# Patient Record
Sex: Male | Born: 1990 | Race: White | Hispanic: No | Smoking: Former smoker
Health system: Southern US, Community
[De-identification: ages and names within clinical notes are randomized; demographics above are authoritative.]

## PROBLEM LIST (undated history)

## (undated) ENCOUNTER — Emergency Department (HOSPITAL_COMMUNITY): Payer: Self-pay | Source: Home / Self Care

## (undated) ENCOUNTER — Emergency Department (HOSPITAL_COMMUNITY): Admission: EM | Source: Home / Self Care

## (undated) DIAGNOSIS — F329 Major depressive disorder, single episode, unspecified: Secondary | ICD-10-CM

## (undated) DIAGNOSIS — F32A Depression, unspecified: Secondary | ICD-10-CM

## (undated) DIAGNOSIS — F319 Bipolar disorder, unspecified: Secondary | ICD-10-CM

## (undated) DIAGNOSIS — M1990 Unspecified osteoarthritis, unspecified site: Secondary | ICD-10-CM

## (undated) HISTORY — PX: TONSILLECTOMY: SUR1361

## (undated) HISTORY — PX: VASECTOMY: SHX75

---
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
## 2009-04-19 ENCOUNTER — Emergency Department (HOSPITAL_COMMUNITY): Admission: EM | Admit: 2009-04-19 | Discharge: 2009-04-19 | Payer: Self-pay | Admitting: Emergency Medicine

## 2009-04-27 ENCOUNTER — Emergency Department (HOSPITAL_COMMUNITY): Admission: EM | Admit: 2009-04-27 | Discharge: 2009-04-27 | Payer: Self-pay | Admitting: Emergency Medicine

## 2011-03-09 LAB — DIFFERENTIAL
Basophils Absolute: 0 10*3/uL (ref 0.0–0.1)
Basophils Relative: 0 % (ref 0–1)
Eosinophils Absolute: 0.1 10*3/uL (ref 0.0–0.7)
Eosinophils Absolute: 0.2 K/uL (ref 0.0–0.7)
Eosinophils Relative: 2 % (ref 0–5)
Eosinophils Relative: 3 % (ref 0–5)
Lymphocytes Relative: 17 % (ref 12–46)
Lymphocytes Relative: 26 % (ref 12–46)
Lymphs Abs: 1.2 10*3/uL (ref 0.7–4.0)
Lymphs Abs: 2.3 K/uL (ref 0.7–4.0)
Monocytes Absolute: 0.4 10*3/uL (ref 0.1–1.0)
Monocytes Absolute: 0.6 K/uL (ref 0.1–1.0)
Monocytes Relative: 6 % (ref 3–12)
Monocytes Relative: 7 % (ref 3–12)
Neutro Abs: 5.7 10*3/uL (ref 1.7–7.7)
Neutrophils Relative %: 65 % (ref 43–77)

## 2011-03-09 LAB — RAPID URINE DRUG SCREEN, HOSP PERFORMED
Amphetamines: NOT DETECTED
Barbiturates: NOT DETECTED
Benzodiazepines: NOT DETECTED
Cocaine: NOT DETECTED
Cocaine: NOT DETECTED
Opiates: NOT DETECTED
Tetrahydrocannabinol: POSITIVE — AB

## 2011-03-09 LAB — BASIC METABOLIC PANEL
BUN: 11 mg/dL (ref 6–23)
BUN: 9 mg/dL (ref 6–23)
CO2: 30 mEq/L (ref 19–32)
Calcium: 9.7 mg/dL (ref 8.4–10.5)
Chloride: 105 mEq/L (ref 96–112)
Chloride: 106 mEq/L (ref 96–112)
Creatinine, Ser: 1.03 mg/dL (ref 0.4–1.5)
GFR calc Af Amer: >60 mL/min (ref >60)
GFR calc Af Amer: >60 mL/min (ref >60)
GFR calc non Af Amer: >60 mL/min (ref >60)
GFR calc non Af Amer: >60 mL/min (ref >60)
Glucose, Bld: 100 mg/dL — ABNORMAL HIGH (ref 70–99)
Potassium: 3.5 mEq/L (ref 3.5–5.1)
Potassium: 3.9 mEq/L (ref 3.5–5.1)
Sodium: 139 mEq/L (ref 135–145)
Sodium: 140 mEq/L (ref 135–145)

## 2011-03-09 LAB — CBC
HCT: 39.7 % (ref 39.0–52.0)
HCT: 41.1 % (ref 39.0–52.0)
Hemoglobin: 14.1 g/dL (ref 13.0–17.0)
Hemoglobin: 14.6 g/dL (ref 13.0–17.0)
MCHC: 35.6 g/dL (ref 30.0–36.0)
MCV: 91.1 fL (ref 78.0–100.0)
MCV: 91.2 fL (ref 78.0–100.0)
Platelets: 222 10*3/uL (ref 150–400)
RBC: 4.35 MIL/uL (ref 4.22–5.81)
RBC: 4.51 MIL/uL (ref 4.22–5.81)
RDW: 13.6 % (ref 11.5–15.5)
WBC: 6.9 10*3/uL (ref 4.0–10.5)
WBC: 8.8 10*3/uL (ref 4.0–10.5)

## 2011-03-09 LAB — URINALYSIS, ROUTINE W REFLEX MICROSCOPIC
Ketones, ur: NEGATIVE mg/dL
Nitrite: NEGATIVE
Urobilinogen, UA: 0.2 mg/dL (ref 0.0–1.0)
pH: 5.5 (ref 5.0–8.0)

## 2011-03-09 LAB — ETHANOL
Alcohol, Ethyl (B): <5 mg/dL (ref 0–10)
Alcohol, Ethyl (B): <5 mg/dL (ref 0–10)

## 2011-04-16 NOTE — Op Note (Signed)
NAME:  Joshua Stone, Joshua Stone              ACCOUNT NO.:  [id number]   MEDICAL RECORD NO.:  [id number]          PATIENT TYPE:  AMB   LOCATION:  [facility]                          FACILITY:  [facility]   PHYSICIAN:  Suzanna Obey, M.D.       DATE OF BIRTH:  02-02-1991   DATE OF PROCEDURE:  03/23/2007  DATE OF DISCHARGE:                               OPERATIVE REPORT   PREOPERATIVE DIAGNOSIS:  Chronic tonsillitis.   POSTOPERATIVE DIAGNOSIS:  Chronic tonsillitis.   SURGICAL PROCEDURE:  Tonsillectomy.   ANESTHESIA:  General.   ESTIMATED BLOOD LOSS:  Less than 5 mL.   INDICATION:  This is a 20 year old who has had repetitive tonsillitis  episodes and sore throats.  It has been refractory to medical therapy.  He is ready to proceed with a tonsillectomy.  He was informed of the  risks and benefits of the procedure including bleeding, infection,  velopharyngeal insufficiency, change in the voice, chronic pain and risk  of the anesthetic.  All questions were answered and consent was  obtained.  This was discussed with his parents as well, consent was  given.   OPERATION:  The patient was taken to the operating room and placed in  the supine position.  After adequate general endotracheal tube  anesthesia, was placed in the rose position, draped in the usual sterile  manner.  The Crowe-Davis mouth gag was inserted, retracted and suspended  from the Mayo stand.  The left tonsil begun making an anterior tonsillar  pillar incision, identifying the capsule tonsil and removing it with  electrocautery dissection.  The right tonsil removed in the same  fashion.  The Crowe-Davis was released and resuspended.  There is  hemostasis present in all locations.  The hypopharynx, esophagus and  stomach were suctioned with the NG tube.  The Crowe-Davis was removed.  Patient was awakened and brought to recovery in stable condition.  Counts are correct.           ______________________________  Suzanna Obey, M.D.     JB/MEDQ  D:  03/23/2007  T:  03/23/2007  Job:  [id number]

## 2011-04-18 ENCOUNTER — Emergency Department (HOSPITAL_COMMUNITY)
Admission: EM | Admit: 2011-04-18 | Discharge: 2011-04-18 | Disposition: A | Discharge status: Home / Self Care | Payer: Self-pay | Attending: Emergency Medicine | Admitting: Emergency Medicine

## 2011-04-18 DIAGNOSIS — L02219 Cutaneous abscess of trunk, unspecified: Secondary | ICD-10-CM | POA: Insufficient documentation

## 2011-05-10 ENCOUNTER — Other Ambulatory Visit (HOSPITAL_COMMUNITY)
Admission: RE | Admit: 2011-05-10 | Discharge: 2011-05-10 | Disposition: A | Discharge status: Home / Self Care | Payer: Medicaid Other | Source: Ambulatory Visit | Attending: Urology | Admitting: Urology

## 2011-05-10 ENCOUNTER — Other Ambulatory Visit: Payer: Self-pay | Admitting: Urology

## 2011-05-10 DIAGNOSIS — Z302 Encounter for sterilization: Secondary | ICD-10-CM | POA: Insufficient documentation

## 2011-06-07 ENCOUNTER — Emergency Department (HOSPITAL_COMMUNITY)
Admission: EM | Admit: 2011-06-07 | Discharge: 2011-06-07 | Disposition: A | Discharge status: Home / Self Care | Payer: Medicaid Other | Attending: Emergency Medicine | Admitting: Emergency Medicine

## 2011-06-07 ENCOUNTER — Encounter: Payer: Self-pay | Admitting: *Deleted

## 2011-06-07 DIAGNOSIS — L089 Local infection of the skin and subcutaneous tissue, unspecified: Secondary | ICD-10-CM | POA: Insufficient documentation

## 2011-06-07 DIAGNOSIS — R609 Edema, unspecified: Secondary | ICD-10-CM | POA: Insufficient documentation

## 2011-06-07 MED ORDER — SULFAMETHOXAZOLE-TMP DS 800-160 MG PO TABS
1.0000 | ORAL_TABLET | Freq: Once | ORAL | Status: AC
  Administered 2011-06-07: 1 via ORAL
  Filled 2011-06-07: qty 1

## 2011-06-07 MED ORDER — SULFAMETHOXAZOLE-TRIMETHOPRIM 800-160 MG PO TABS: 1.0000 | ORAL_TABLET | Freq: Two times a day (BID) | ORAL | Status: AC

## 2011-06-07 NOTE — ED Notes (Signed)
C/o 2 ?abscessed areas to right groin area; also c/o ?spider bite to left inner thigh onset last night-area is reddened, warm to touch, with streaking up left thigh

## 2011-06-08 NOTE — ED Provider Notes (Signed)
History     Chief Complaint  Patient presents with  . Abscess   HPI  History reviewed. No pertinent past medical history.  Past Surgical History  Procedure Date  . Tonsillectomy     No family history on file.  History  Substance Use Topics  . Smoking status: Never Smoker   . Smokeless tobacco: Not on file  . Alcohol Use: Yes     ocassional      Review of Systems  Physical Exam  BP 142/77  Pulse 66  Temp(Src) 99 F (37.2 C) (Oral)  Resp 20  Ht 5\' 10"  (1.778 m)  Wt 180 lb (81.647 kg)  BMI 25.83 kg/m2  SpO2 100%  Physical Exam  ED Course  Procedures  MDM  I agree with the history, physical, assessment, and plan of care, with the following exceptions:    Ebony Hail, MD 06/14/11 [phone]

## 2011-06-12 NOTE — ED Provider Notes (Addendum)
History     Chief Complaint  Patient presents with  . Abscess   HPI Comments: He woke yesterday morning with a presumed spider bite of his left upper thigh which has become more reddened and warm to touch.  He did not see a spider.  Also reports prior episode of similar lesion in right suprapubic area which has almost resolved,  But still has a firm tender "knot" at the location  Patient is a 20 y.o. male presenting with abscess. The history is provided by the patient.  Abscess  This is a new problem. The current episode started yesterday. The onset was gradual. The problem has been gradually worsening. The abscess is present on the left upper leg. The problem is moderate. The abscess is characterized by redness. Pertinent negatives include no fever, no congestion and no sore throat. His past medical history does not include skin abscesses in family. There were no sick contacts. He has received no recent medical care.    History reviewed. No pertinent past medical history.  Past Surgical History  Procedure Date  . Tonsillectomy     No family history on file.  History  Substance Use Topics  . Smoking status: Never Smoker   . Smokeless tobacco: Not on file  . Alcohol Use: Yes     ocassional      Review of Systems  Constitutional: Negative for fever.  HENT: Negative for congestion, sore throat and neck pain.   Eyes: Negative.   Respiratory: Negative for chest tightness and shortness of breath.   Cardiovascular: Negative for chest pain.  Gastrointestinal: Negative for nausea and abdominal pain.  Genitourinary: Negative for dysuria and difficulty urinating.  Musculoskeletal: Negative for joint swelling and arthralgias.  Skin: Negative for rash.  Neurological: Negative for dizziness, weakness, numbness and headaches.  Hematological: Negative.   Psychiatric/Behavioral: Negative.     Physical Exam  BP 142/77  Pulse 66  Temp(Src) 99 F (37.2 C) (Oral)  Resp 20  Ht 5\' 10"   (1.778 m)  Wt 180 lb (81.647 kg)  BMI 25.83 kg/m2  SpO2 100%  Physical Exam  Vitals reviewed. Constitutional: He is oriented to person, place, and time. He appears well-developed and well-nourished.  HENT:  Head: Normocephalic and atraumatic.  Eyes: Conjunctivae are normal.  Neck: Normal range of motion.  Cardiovascular: Normal rate, regular rhythm, normal heart sounds and intact distal pulses.   Pulmonary/Chest: Effort normal and breath sounds normal. He has no wheezes.  Abdominal: Soft. Bowel sounds are normal. There is no tenderness.  Musculoskeletal: Normal range of motion.  Neurological: He is alert and oriented to person, place, and time.  Skin: Skin is warm and dry. Lesion noted. No rash noted. There is erythema.          Edema noted left medial thigh,  No fluctuance or drainage,  approx 2 cm diameter with surrounding erythema of 3 cm.  Small induration right suprapubic area,  Nontender,  No erythema.  Psychiatric: He has a normal mood and affect.    ED Course  Procedures  MDM No palpable drainable abscess.  Medical screening examination/treatment/procedure(s) were conducted as a shared visit with non-physician practitioner(s) and myself.  I personally evaluated the patient during the encounter     Candis Musa, PA 06/12/11 1950  Candis Musa, PA 06/25/11 1641  Sunnie Nielsen, MD 06/29/11 [phone]

## 2011-12-08 ENCOUNTER — Emergency Department (HOSPITAL_COMMUNITY)
Admission: EM | Admit: 2011-12-08 | Discharge: 2011-12-08 | Disposition: A | Discharge status: Home / Self Care | Payer: Self-pay | Attending: Emergency Medicine | Admitting: Emergency Medicine

## 2011-12-08 ENCOUNTER — Ambulatory Visit (HOSPITAL_COMMUNITY)
Admission: RE | Admit: 2011-12-08 | Discharge: 2011-12-08 | Disposition: A | Discharge status: Home / Self Care | Payer: Self-pay | Source: Ambulatory Visit | Attending: Emergency Medicine | Admitting: Emergency Medicine

## 2011-12-08 ENCOUNTER — Other Ambulatory Visit (HOSPITAL_COMMUNITY): Payer: Self-pay | Admitting: Emergency Medicine

## 2011-12-08 DIAGNOSIS — R52 Pain, unspecified: Secondary | ICD-10-CM

## 2011-12-08 DIAGNOSIS — S62339A Displaced fracture of neck of unspecified metacarpal bone, initial encounter for closed fracture: Secondary | ICD-10-CM | POA: Insufficient documentation

## 2011-12-08 DIAGNOSIS — X838XXA Intentional self-harm by other specified means, initial encounter: Secondary | ICD-10-CM | POA: Insufficient documentation

## 2011-12-08 MED ORDER — ALBUTEROL SULFATE HFA 108 (90 BASE) MCG/ACT IN AERS
2.0000 | INHALATION_SPRAY | Freq: Once | RESPIRATORY_TRACT | Status: AC
  Administered 2011-12-08: 2 via RESPIRATORY_TRACT
  Filled 2011-12-08: qty 6.7

## 2011-12-08 MED ORDER — HYDROCODONE-ACETAMINOPHEN 5-325 MG PO TABS: 2.0000 | ORAL_TABLET | ORAL | Status: AC | PRN

## 2011-12-08 NOTE — ED Notes (Signed)
Pt triaged on downtime forms.

## 2011-12-08 NOTE — ED Provider Notes (Signed)
History     CSN: [id number]  Arrival date & time 12/08/11  0117   None     No chief complaint on file.   (Consider location/radiation/quality/duration/timing/severity/associated sxs/prior treatment) HPI Comments: Patient presents with right hand pain after punching his car 4 days ago. Pain is over the fifth MCP joint and ulnar side of the hand. Is not taking anything for the pain. He has full range of motion of his fingers and does not have any numbness or weakness or tingling. There no open wounds in the skin. He also complains of a cough for the past 2 weeks that is productive at times. He is an active smoker. No chest pain or shortness of breath.  The history is provided by the patient.    No past medical history on file.  Past Surgical History  Procedure Date  . Tonsillectomy     No family history on file.  History  Substance Use Topics  . Smoking status: Never Smoker   . Smokeless tobacco: Not on file  . Alcohol Use: Yes     ocassional      Review of Systems  All other systems reviewed and are negative.    Allergies  Cashews and Zoloft  Home Medications  No current outpatient prescriptions on file.  There were no vitals taken for this visit.  Physical Exam  Constitutional: He is oriented to person, place, and time. He appears well-developed and well-nourished. No distress.  HENT:  Head: Normocephalic and atraumatic.  Mouth/Throat: Oropharynx is clear and moist.  Eyes: Conjunctivae and EOM are normal. Pupils are equal, round, and reactive to light.  Neck: Neck supple.  Cardiovascular: Normal rate, regular rhythm and normal heart sounds.   Pulmonary/Chest: Effort normal and breath sounds normal. No respiratory distress. He has no wheezes.  Abdominal: Soft. There is no tenderness.  Musculoskeletal: He exhibits edema and tenderness.       Tenderness and swelling over distal right fifth metacarpal. No breaks in skin. Cardinal hand movements intact. +2  radial pulse. No disruption of finger cascade  Neurological: He is oriented to person, place, and time. No cranial nerve deficit.  Skin: Skin is warm.    ED Course  Procedures (including critical care time)  Labs Reviewed - No data to display Dg Hand Complete Right  12/08/2011  *RADIOLOGY REPORT*  Clinical Data: Right hand pain.  Fifth metacarpal pain  RIGHT HAND - COMPLETE 3+ VIEW  Comparison: 04/27/2009.  Findings: There is an angulated fifth metacarpal neck fracture compatible with boxers fracture.  Apex dorsal angulation.  Soft tissue swelling is present.  The phalanges of the fifth finger appear normal.  Sclerosis in the distal scaphoid pole is compatible with a large bone island.  IMPRESSION: Fifth metacarpal neck fracture consistent with boxers fracture.  Original Report Authenticated By: Andreas Newport, M.D.     No diagnosis found.    MDM  Hand Injury with likely a boxer's fracture.  Neurovascularly intact.  No breaks in skin.  Splint, pain control, f/u hand surgery clinic.         Glynn Octave, MD 12/08/11 [phone]

## 2012-05-30 ENCOUNTER — Emergency Department (HOSPITAL_COMMUNITY)
Admission: EM | Admit: 2012-05-30 | Discharge: 2012-05-30 | Disposition: A | Discharge status: Home / Self Care | Payer: Self-pay | Attending: Emergency Medicine | Admitting: Emergency Medicine

## 2012-05-30 ENCOUNTER — Encounter (HOSPITAL_COMMUNITY): Payer: Self-pay | Admitting: *Deleted

## 2012-05-30 DIAGNOSIS — Y990 Civilian activity done for income or pay: Secondary | ICD-10-CM | POA: Insufficient documentation

## 2012-05-30 DIAGNOSIS — W268XXA Contact with other sharp object(s), not elsewhere classified, initial encounter: Secondary | ICD-10-CM | POA: Insufficient documentation

## 2012-05-30 DIAGNOSIS — S91309A Unspecified open wound, unspecified foot, initial encounter: Secondary | ICD-10-CM | POA: Insufficient documentation

## 2012-05-30 DIAGNOSIS — Y9289 Other specified places as the place of occurrence of the external cause: Secondary | ICD-10-CM | POA: Insufficient documentation

## 2012-05-30 DIAGNOSIS — S91331A Puncture wound without foreign body, right foot, initial encounter: Secondary | ICD-10-CM

## 2012-05-30 MED ORDER — TETANUS-DIPHTH-ACELL PERTUSSIS 5-2.5-18.5 LF-MCG/0.5 IM SUSP
0.5000 mL | Freq: Once | INTRAMUSCULAR | Status: AC
  Administered 2012-05-30: 0.5 mL via INTRAMUSCULAR
  Filled 2012-05-30: qty 0.5

## 2012-05-30 NOTE — ED Notes (Signed)
Pt states that he found a piece of rusted metal in his right foot on Sunday. States that he pulled it out but needs a tetanus shot.

## 2012-05-30 NOTE — ED Provider Notes (Signed)
History     CSN: [id number]  Arrival date & time 05/30/12  1735   First MD Initiated Contact with Patient 05/30/12 1744      Chief Complaint  Patient presents with  . Foot Injury    (Consider location/radiation/quality/duration/timing/severity/associated sxs/prior treatment) HPI Comments: Patient comes to the ER requesting a tetanus shot. States that he removed a piece of metal from his right foot 2 days ago. He states  the foreign body was removed completely. He denies any pain, swelling, redness, numbness or drainage of the foot. States he is able to bear weight without any difficulty.  Patient is a 21 y.o. male presenting with foot injury. The history is provided by the patient.  Foot Injury  The incident occurred 2 days ago. The incident occurred at work. Injury mechanism: puncture wound to lateral right foot. The pain is present in the right foot. The patient is experiencing no pain. Pertinent negatives include no numbness, no inability to bear weight, no loss of motion, no muscle weakness, no loss of sensation and no tingling. He reports no foreign bodies present. Nothing aggravates the symptoms. He has tried nothing for the symptoms. The treatment provided no relief.    History reviewed. No pertinent past medical history.  Past Surgical History  Procedure Date  . Tonsillectomy     History reviewed. No pertinent family history.  History  Substance Use Topics  . Smoking status: Never Smoker   . Smokeless tobacco: Not on file  . Alcohol Use: Yes     ocassional      Review of Systems  Constitutional: Negative for fever and chills.  Genitourinary: Negative for dysuria and difficulty urinating.  Musculoskeletal: Negative for joint swelling, arthralgias and gait problem.  Skin: Positive for wound. Negative for color change.  Neurological: Negative for tingling and numbness.  All other systems reviewed and are negative.    Allergies  Cashews and Zoloft  Home  Medications  No current outpatient prescriptions on file.  BP 142/80  Pulse 60  Temp 98.6 F (37 C) (Oral)  Resp 16  Ht 5\' 10"  (1.778 m)  Wt 170 lb (77.111 kg)  BMI 24.39 kg/m2  Physical Exam  Nursing note and vitals reviewed. Constitutional: He is oriented to person, place, and time. He appears well-developed and well-nourished. No distress.  HENT:  Head: Normocephalic and atraumatic.  Cardiovascular: Normal rate, regular rhythm, normal heart sounds and intact distal pulses.   No murmur heard. Pulmonary/Chest: Effort normal and breath sounds normal.  Musculoskeletal: Normal range of motion. He exhibits no edema and no tenderness.       Right foot: He exhibits normal range of motion, no tenderness, no bony tenderness, no swelling, no crepitus, no deformity and no laceration.       Feet:       Pin point, well healed puncture wound of the lateral right foot.    ROM is preserved.  DP pulse is brisk, sensation intact.  No erythema, edema, abrasion, bruising or deformity.    Neurological: He is alert and oriented to person, place, and time. He exhibits normal muscle tone. Coordination normal.  Skin: Skin is warm and dry.    ED Course  Procedures (including critical care time)     TDaP given IM  MDM     Well healed puncture wound to right foot.  Patient requesting tetanus.  Advised to return to ER if needed     Agapita Savarino L. Shawntae Lowy, [place] 05/30/12 1807

## 2012-05-30 NOTE — ED Notes (Signed)
Waiting shot time.

## 2012-05-31 NOTE — ED Provider Notes (Signed)
Medical screening examination/treatment/procedure(s) were performed by non-physician practitioner and as supervising physician I was immediately available for consultation/collaboration.   Laray Anger, DO 05/31/12 1844

## 2012-07-25 ENCOUNTER — Emergency Department (HOSPITAL_COMMUNITY)
Admission: EM | Admit: 2012-07-25 | Discharge: 2012-07-25 | Disposition: A | Discharge status: Home / Self Care | Payer: Self-pay | Attending: Emergency Medicine | Admitting: Emergency Medicine

## 2012-07-25 ENCOUNTER — Encounter (HOSPITAL_COMMUNITY): Payer: Self-pay | Admitting: Emergency Medicine

## 2012-07-25 DIAGNOSIS — J069 Acute upper respiratory infection, unspecified: Secondary | ICD-10-CM | POA: Insufficient documentation

## 2012-07-25 DIAGNOSIS — J029 Acute pharyngitis, unspecified: Secondary | ICD-10-CM | POA: Insufficient documentation

## 2012-07-25 DIAGNOSIS — Z91018 Allergy to other foods: Secondary | ICD-10-CM | POA: Insufficient documentation

## 2012-07-25 DIAGNOSIS — Z888 Allergy status to other drugs, medicaments and biological substances status: Secondary | ICD-10-CM | POA: Insufficient documentation

## 2012-07-25 MED ORDER — DIPHENHYD-HYDROCORT-NYSTATIN MT SUSP: OROMUCOSAL | Status: DC

## 2012-07-25 MED ORDER — GUAIFENESIN-CODEINE 100-10 MG/5ML PO SYRP: 10.0000 mL | ORAL_SOLUTION | Freq: Three times a day (TID) | ORAL | Status: AC | PRN

## 2012-07-25 NOTE — ED Notes (Signed)
Feverish and sore throat with cough since yesterday.

## 2012-07-27 NOTE — ED Provider Notes (Signed)
Medical screening examination/treatment/procedure(s) were performed by non-physician practitioner and as supervising physician I was immediately available for consultation/collaboration. [name], MD, FACEP   Ward Givens, MD 07/27/12 Joshua Stone

## 2012-07-27 NOTE — ED Provider Notes (Signed)
History     CSN: [id number]  Arrival date & time 07/25/12  4098   First MD Initiated Contact with Patient 07/25/12 [phone]      Chief Complaint  Patient presents with  . Sore Throat  . Fever  . Cough    (Consider location/radiation/quality/duration/timing/severity/associated sxs/prior treatment) Patient is a 21 y.o. male presenting with pharyngitis, fever, and cough. The history is provided by the patient.  Sore Throat This is a new problem. The current episode started yesterday. The problem occurs constantly. The problem has been gradually worsening. Associated symptoms include coughing, a fever and a sore throat. Pertinent negatives include no abdominal pain, arthralgias, chills, congestion, headaches, joint swelling, myalgias, nausea, neck pain, numbness, rash, swollen glands, urinary symptoms, visual change, vomiting or weakness. The symptoms are aggravated by swallowing. Treatments tried: OTC cold medications. The treatment provided no relief.  Fever Primary symptoms of the febrile illness include fever and cough. Primary symptoms do not include visual change, headaches, wheezing, shortness of breath, abdominal pain, nausea, vomiting, myalgias, arthralgias or rash.  Cough Associated symptoms include rhinorrhea and sore throat. Pertinent negatives include no chills, no ear pain, no headaches, no myalgias, no shortness of breath and no wheezing.    History reviewed. No pertinent past medical history.  Past Surgical History  Procedure Date  . Tonsillectomy     History reviewed. No pertinent family history.  History  Substance Use Topics  . Smoking status: Never Smoker   . Smokeless tobacco: Not on file  . Alcohol Use: Yes     ocassional      Review of Systems  Constitutional: Positive for fever. Negative for chills, activity change and appetite change.  HENT: Positive for sore throat and rhinorrhea. Negative for ear pain, congestion, facial swelling, trouble swallowing,  neck pain and neck stiffness.   Eyes: Negative for visual disturbance.  Respiratory: Positive for cough. Negative for chest tightness, shortness of breath, wheezing and stridor.   Gastrointestinal: Negative for nausea, vomiting and abdominal pain.  Musculoskeletal: Negative for myalgias, joint swelling and arthralgias.  Skin: Negative.  Negative for rash.  Neurological: Negative for dizziness, weakness, numbness and headaches.  Hematological: Negative for adenopathy.  Psychiatric/Behavioral: Negative for confusion.  All other systems reviewed and are negative.    Allergies  Cashews and Zoloft  Home Medications   Current Outpatient Rx  Name Route Sig Dispense Refill  . DIPHENHYD-HYDROCORT-NYSTATIN MT SUSP  5 ml po swish and spit TID 60 mL 0  . GUAIFENESIN-CODEINE 100-10 MG/5ML PO SYRP Oral Take 10 mLs by mouth 3 (three) times daily as needed for cough. 120 mL 0    BP 117/76  Pulse 62  Temp 98 F (36.7 C) (Oral)  Resp 16  Ht 5\' 10"  (1.778 m)  Wt 170 lb (77.111 kg)  BMI 24.39 kg/m2  SpO2 98%  Physical Exam  Nursing note and vitals reviewed. Constitutional: He is oriented to person, place, and time. He appears well-developed and well-nourished. No distress.  HENT:  Head: Normocephalic and atraumatic. No trismus in the jaw.  Right Ear: Tympanic membrane and ear canal normal.  Left Ear: Tympanic membrane and ear canal normal.  Nose: Mucosal edema present. No rhinorrhea.  Mouth/Throat: Uvula is midline and mucous membranes are normal. No uvula swelling. Posterior oropharyngeal erythema present. No oropharyngeal exudate, posterior oropharyngeal edema or tonsillar abscesses.  Neck: Normal range of motion and phonation normal. Neck supple. No Brudzinski's sign and no Kernig's sign noted.  Cardiovascular: Normal rate, regular rhythm, normal  heart sounds and intact distal pulses.   No murmur heard. Pulmonary/Chest: Effort normal and breath sounds normal. He has no wheezes. He has no  rales.  Abdominal: Soft. Bowel sounds are normal.  Musculoskeletal: He exhibits no edema.  Lymphadenopathy:    He has no cervical adenopathy.  Neurological: He is alert and oriented to person, place, and time. He exhibits normal muscle tone. Coordination normal.  Skin: Skin is warm and dry.    ED Course  Procedures (including critical care time)  Labs Reviewed - No data to display No results found.   1. Pharyngitis   2. URI (upper respiratory infection)       MDM    Pt is non-toxic appearing.  Vitals stable.  Handles secretions w/o difficulty.  Sx's likely related to viral process.    Pt agrees to return here if the sx's are not improving.  Will treat symptomatically.     Prescribed: Magic mouthwash Guaifenesin-codeine cough syrup     Muaz Shorey L. [place], [place] 07/27/12 1718

## 2013-04-10 ENCOUNTER — Emergency Department (HOSPITAL_COMMUNITY): Payer: Self-pay

## 2013-04-10 ENCOUNTER — Encounter (HOSPITAL_COMMUNITY): Payer: Self-pay | Admitting: Emergency Medicine

## 2013-04-10 ENCOUNTER — Emergency Department (HOSPITAL_COMMUNITY)
Admission: EM | Admit: 2013-04-10 | Discharge: 2013-04-10 | Disposition: A | Discharge status: Home / Self Care | Payer: Self-pay | Attending: Emergency Medicine | Admitting: Emergency Medicine

## 2013-04-10 DIAGNOSIS — S61409A Unspecified open wound of unspecified hand, initial encounter: Secondary | ICD-10-CM | POA: Insufficient documentation

## 2013-04-10 DIAGNOSIS — F319 Bipolar disorder, unspecified: Secondary | ICD-10-CM | POA: Insufficient documentation

## 2013-04-10 DIAGNOSIS — Y9289 Other specified places as the place of occurrence of the external cause: Secondary | ICD-10-CM | POA: Insufficient documentation

## 2013-04-10 DIAGNOSIS — Y9389 Activity, other specified: Secondary | ICD-10-CM | POA: Insufficient documentation

## 2013-04-10 DIAGNOSIS — W268XXA Contact with other sharp object(s), not elsewhere classified, initial encounter: Secondary | ICD-10-CM | POA: Insufficient documentation

## 2013-04-10 DIAGNOSIS — Y990 Civilian activity done for income or pay: Secondary | ICD-10-CM | POA: Insufficient documentation

## 2013-04-10 DIAGNOSIS — S61431A Puncture wound without foreign body of right hand, initial encounter: Secondary | ICD-10-CM

## 2013-04-10 HISTORY — DX: Major depressive disorder, single episode, unspecified: F32.9

## 2013-04-10 HISTORY — DX: Depression, unspecified: F32.A

## 2013-04-10 HISTORY — DX: Bipolar disorder, unspecified: F31.9

## 2013-04-10 MED ORDER — BACITRACIN ZINC 500 UNIT/GM EX OINT
TOPICAL_OINTMENT | CUTANEOUS | Status: AC
  Filled 2013-04-10: qty 0.9

## 2013-04-10 MED ORDER — CEFAZOLIN SODIUM 1-5 GM-% IV SOLN
1.0000 g | Freq: Once | INTRAVENOUS | Status: AC
  Administered 2013-04-10: 1 g via INTRAVENOUS
  Filled 2013-04-10: qty 50

## 2013-04-10 MED ORDER — CEPHALEXIN 500 MG PO CAPS: 500.0000 mg | ORAL_CAPSULE | Freq: Four times a day (QID) | ORAL | Status: DC

## 2013-04-10 MED ORDER — HYDROCODONE-ACETAMINOPHEN 5-325 MG PO TABS: 1.0000 | ORAL_TABLET | ORAL | Status: DC | PRN

## 2013-04-10 NOTE — ED Provider Notes (Signed)
Medical screening examination/treatment/procedure(s) were performed by non-physician practitioner and as supervising physician I was immediately available for consultation/collaboration.   Jaramiah Bossard L Lakita Sahlin, MD 04/10/13 2238 

## 2013-04-10 NOTE — ED Provider Notes (Signed)
History     CSN: [id number]  Arrival date & time 04/10/13  1712   First MD Initiated Contact with Patient 04/10/13 1727      Chief Complaint  Patient presents with  . Puncture Wound    (Consider location/radiation/quality/duration/timing/severity/associated sxs/prior treatment) HPI Comments: Joshua Stone is a 22 y.o. Male presenting with a puncture wound to the palm of his right hand which occurred around 10 am today at work on a sharp metal strapping edge.  He cleaned the wound which bled briefly,  Then finished his job.  His pain has been worsening since the event and he now has stiffness and decreasing ability to flex his long finger.  He denies drainage from the wound and has had no noticeable increased swelling.       The history is provided by the patient.    Past Medical History  Diagnosis Date  . Depression   . Bipolar 1 disorder     Past Surgical History  Procedure Laterality Date  . Tonsillectomy      History reviewed. No pertinent family history.  History  Substance Use Topics  . Smoking status: Never Smoker   . Smokeless tobacco: Current User    Types: Snuff  . Alcohol Use: Yes     Comment: ocassional      Review of Systems  Constitutional: Negative for fever and chills.  Musculoskeletal: Positive for arthralgias. Negative for myalgias and joint swelling.  Skin: Positive for wound. Negative for color change and rash.  Neurological: Negative for weakness and numbness.    Allergies  Cashews and Zoloft  Home Medications   Current Outpatient Rx  Name  Route  Sig  Dispense  Refill  . cephALEXin (KEFLEX) 500 MG capsule   Oral   Take 1 capsule (500 mg total) by mouth 4 (four) times daily.   40 capsule   0   . HYDROcodone-acetaminophen (NORCO/VICODIN) 5-325 MG per tablet   Oral   Take 1 tablet by mouth every 4 (four) hours as needed for pain.   20 tablet   0     BP 141/96  Pulse 80  Temp(Src) 98.7 F (37.1 C) (Oral)  Resp 16  Ht 5'  9" (1.753 m)  Wt 177 lb (80.287 kg)  BMI 26.13 kg/m2  SpO2 100%  Physical Exam  Constitutional: He appears well-developed and well-nourished.  HENT:  Head: Atraumatic.  Neck: Normal range of motion.  Cardiovascular:  Pulses equal bilaterally  Musculoskeletal: He exhibits tenderness.  Small puncture mid right palm, no drainage,  Erythema, red streaking or edema.  Increased  pain with flexion and extension of his right long finger.  Neurological: He is alert. He has normal strength. He displays normal reflexes. No sensory deficit.  Equal strength  Skin: Skin is warm and dry.  Psychiatric: He has a normal mood and affect.    ED Course  Procedures (including critical care time)  Labs Reviewed - No data to display Dg Hand Complete Right  04/10/2013  *RADIOLOGY REPORT*  Clinical Data: Puncture wound between third and fourth metacarpals.  RIGHT HAND - COMPLETE 3+ VIEW  Comparison: 12/08/2011  Findings: No acute bony abnormality.  Specifically, no fracture, subluxation, or dislocation.  Soft tissues are intact. Joint spaces are maintained.  Normal bone mineralization.  No radiopaque foreign bodies or soft tissue gas.  IMPRESSION: No bony abnormality.   Original Report Authenticated By: Charlett Nose, M.D.      1. Puncture wound of right hand with complication,  initial encounter       MDM  Discussed case with Dr. Izora Ribas who will see patient in his office tomorrow.  Recommended ancef 1 gram now,  Keflex prescription.  He was prescribed hydrocodone for pain.  Finger splint and wound dressing applied by RN.    Pt with puncture wound with progressing pain with attempted ROM of long finger suggesting direct injury to flexor tendon and possible infection,  Although there are no exam findings of infection at this time.    Patients labs and/or radiological studies were viewed and considered during the medical decision making and disposition process.         [name], PA-C 04/10/13  1946

## 2013-04-10 NOTE — ED Notes (Signed)
Patient has puncture wound to palm of right hand. Per patient stabbed hand by accident with piece of metal that wraps around palate. Per patient had tetanus shot last year.

## 2013-04-12 ENCOUNTER — Emergency Department (HOSPITAL_COMMUNITY)
Admission: EM | Admit: 2013-04-12 | Discharge: 2013-04-12 | Disposition: A | Discharge status: Home / Self Care | Payer: Self-pay | Attending: Emergency Medicine | Admitting: Emergency Medicine

## 2013-04-12 ENCOUNTER — Encounter (HOSPITAL_COMMUNITY): Payer: Self-pay | Admitting: *Deleted

## 2013-04-12 DIAGNOSIS — Y990 Civilian activity done for income or pay: Secondary | ICD-10-CM | POA: Insufficient documentation

## 2013-04-12 DIAGNOSIS — S61431D Puncture wound without foreign body of right hand, subsequent encounter: Secondary | ICD-10-CM

## 2013-04-12 DIAGNOSIS — Z8659 Personal history of other mental and behavioral disorders: Secondary | ICD-10-CM | POA: Insufficient documentation

## 2013-04-12 DIAGNOSIS — Y939 Activity, unspecified: Secondary | ICD-10-CM | POA: Insufficient documentation

## 2013-04-12 DIAGNOSIS — Y9289 Other specified places as the place of occurrence of the external cause: Secondary | ICD-10-CM | POA: Insufficient documentation

## 2013-04-12 DIAGNOSIS — X58XXXA Exposure to other specified factors, initial encounter: Secondary | ICD-10-CM | POA: Insufficient documentation

## 2013-04-12 DIAGNOSIS — S61409A Unspecified open wound of unspecified hand, initial encounter: Secondary | ICD-10-CM | POA: Insufficient documentation

## 2013-04-12 NOTE — ED Provider Notes (Signed)
History  This chart was scribed for Joya Gaskins, MD,  by Concha Se, ED Scribe. The patient was seen in room APFT20/APFT20 and the patient's care was started at 6:12 PM.   CSN: [id number]  Arrival date & time 04/12/13  1752   Chief Complaint  Patient presents with  . Hand Injury   Patient is a 22 y.o. male presenting with hand injury. The history is provided by the patient and medical records. No language interpreter was used.  Hand Injury Associated symptoms: no fever    HPI Comments: Joshua Stone is a 22 y.o. male who presents to the Emergency Department complaining of moderate right hand pain with an onset of swelling  2 days ago that occurred at work. It is now improving  Pt states subsided pain and taking antibiotics to treat his symptoms. Pt states he will go back to work tomorrow.  Pt denies fever,chills. Pt also denies any other health problems.  Past Medical History  Diagnosis Date  . Depression   . Bipolar 1 disorder     Past Surgical History  Procedure Laterality Date  . Tonsillectomy      No family history on file.  History  Substance Use Topics  . Smoking status: Never Smoker   . Smokeless tobacco: Current User    Types: Snuff  . Alcohol Use: Yes     Comment: ocassional      Review of Systems  Constitutional: Negative for fever.  Skin: Positive for wound.     Allergies  Cashews and Zoloft  Home Medications   Current Outpatient Rx  Name  Route  Sig  Dispense  Refill  . cephALEXin (KEFLEX) 500 MG capsule   Oral   Take 1 capsule (500 mg total) by mouth 4 (four) times daily.   40 capsule   0   . HYDROcodone-acetaminophen (NORCO/VICODIN) 5-325 MG per tablet   Oral   Take 1 tablet by mouth every 4 (four) hours as needed for pain.   20 tablet   0     BP 147/85  Pulse 53  Temp(Src) 98.2 F (36.8 C) (Oral)  Resp 16  Ht 5\' 9"  (1.753 m)  Wt 177 lb (80.287 kg)  BMI 26.13 kg/m2  SpO2 97%  Physical Exam CONSTITUTIONAL: Well  developed/well nourished HEAD: Normocephalic/atraumatic EYES: EOMI/PERRL ENMT: Mucous membranes moist NECK: supple no meningeal signs CV: S1/S2 noted, no murmurs/rubs/gallops noted LUNGS: Lungs are clear to auscultation bilaterally, no apparent distress ABDOMEN: soft, nontender, no rebound or guarding NEURO: Pt is awake/alert, moves all extremitiesx4 EXTREMITIES: pulses normal, full ROM, well healing puncture wound to right palm with out erythema or drainage. Full flexion and extension with fingers of right hand.  No evidence of flexion deficit to any of his fingers to right hand SKIN: warm, color normal PSYCH: no abnormalities of mood noted ED Course  Procedures DIAGNOSTIC STUDIES: Oxygen Saturation is 97% on room air, adequate by my interpretation.    COORDINATION OF CARE 6:10 PM Discussed ED treatment with pt and pt agrees.   Review of last chart reveals concern for direct injury/infection to right hand/flexor tendon Other than puncture wound there is no other wound/erythema.  He has no obvious flexion deficit.  He reports his pain is essentially resolved.  I have cleared him for work but asked him to f/u with Hand as instructed and continue antibiotics   MDM  Nursing notes including past medical history and social history reviewed and considered in documentation Previous records reviewed  and considered       I personally performed the services described in this documentation, which was scribed in my presence. The recorded information has been reviewed and is accurate.      Joya Gaskins, MD 04/12/13 Ebony Cargo

## 2013-04-12 NOTE — ED Notes (Signed)
Hand injury x 2 days ago - states is better but has not followed up with an orthopedic.  States is here to have restriction lifted so he can return to work.  Denies pain.

## 2013-04-12 NOTE — ED Notes (Addendum)
Pt has been seen for finger injury, says he cannot afford to go to  MD for follow up Wants a note saying he can go back to work.  Denies pain. Dr Bebe Shaggy in to see pt

## 2013-10-07 ENCOUNTER — Encounter (HOSPITAL_COMMUNITY): Payer: Self-pay | Admitting: Emergency Medicine

## 2013-10-07 ENCOUNTER — Emergency Department (HOSPITAL_COMMUNITY)
Admission: EM | Admit: 2013-10-07 | Discharge: 2013-10-07 | Disposition: A | Discharge status: Home / Self Care | Payer: Self-pay | Attending: Emergency Medicine | Admitting: Emergency Medicine

## 2013-10-07 DIAGNOSIS — J029 Acute pharyngitis, unspecified: Secondary | ICD-10-CM | POA: Insufficient documentation

## 2013-10-07 DIAGNOSIS — Z792 Long term (current) use of antibiotics: Secondary | ICD-10-CM | POA: Insufficient documentation

## 2013-10-07 DIAGNOSIS — Z8659 Personal history of other mental and behavioral disorders: Secondary | ICD-10-CM | POA: Insufficient documentation

## 2013-10-07 MED ORDER — PENICILLIN V POTASSIUM 250 MG PO TABS
500.0000 mg | ORAL_TABLET | Freq: Once | ORAL | Status: AC
  Administered 2013-10-07: 500 mg via ORAL
  Filled 2013-10-07: qty 2

## 2013-10-07 MED ORDER — IBUPROFEN 800 MG PO TABS
800.0000 mg | ORAL_TABLET | Freq: Once | ORAL | Status: AC
  Administered 2013-10-07: 800 mg via ORAL
  Filled 2013-10-07: qty 1

## 2013-10-07 MED ORDER — AMOXICILLIN 500 MG PO CAPS: 500.0000 mg | ORAL_CAPSULE | Freq: Three times a day (TID) | ORAL | Status: DC

## 2013-10-07 NOTE — ED Provider Notes (Signed)
Medical screening examination/treatment/procedure(s) were performed by non-physician practitioner and as supervising physician I was immediately available for consultation/collaboration.  Flint Melter, MD 10/07/13 1525

## 2013-10-07 NOTE — ED Notes (Signed)
Pt c/o sore throat, ?fever for a week,

## 2013-10-07 NOTE — ED Provider Notes (Signed)
CSN: [id number]     Arrival date & time 10/07/13  1422 History   First MD Initiated Contact with Patient 10/07/13 1440     Chief Complaint  Patient presents with  . Sore Throat   (Consider location/radiation/quality/duration/timing/severity/associated sxs/prior Treatment) Patient is a 22 y.o. male presenting with pharyngitis. The history is provided by the patient.  Sore Throat This is a new problem. The current episode started 1 to 4 weeks ago. The problem occurs daily. The problem has been gradually worsening. Associated symptoms include chills, a fever, headaches and myalgias. Pertinent negatives include no abdominal pain, arthralgias, chest pain, coughing, neck pain or vomiting. The symptoms are aggravated by swallowing. He has tried acetaminophen for the symptoms. The treatment provided no relief.    Past Medical History  Diagnosis Date  . Depression   . Bipolar 1 disorder    Past Surgical History  Procedure Laterality Date  . Tonsillectomy     No family history on file. History  Substance Use Topics  . Smoking status: Never Smoker   . Smokeless tobacco: Current User    Types: Snuff  . Alcohol Use: Yes     Comment: ocassional    Review of Systems  Constitutional: Positive for fever and chills. Negative for activity change.       All ROS Neg except as noted in HPI  HENT: Negative for nosebleeds.   Eyes: Negative for photophobia and discharge.  Respiratory: Negative for cough, shortness of breath and wheezing.   Cardiovascular: Negative for chest pain and palpitations.  Gastrointestinal: Negative for vomiting, abdominal pain and blood in stool.  Genitourinary: Negative for dysuria, frequency and hematuria.  Musculoskeletal: Positive for myalgias. Negative for arthralgias, back pain and neck pain.  Skin: Negative.   Neurological: Positive for headaches. Negative for dizziness, seizures and speech difficulty.  Psychiatric/Behavioral: Negative for hallucinations and  confusion.    Allergies  Cashews and Zoloft  Home Medications   Current Outpatient Rx  Name  Route  Sig  Dispense  Refill  . amoxicillin (AMOXIL) 500 MG capsule   Oral   Take 1 capsule (500 mg total) by mouth 3 (three) times daily.   21 capsule   0   . cephALEXin (KEFLEX) 500 MG capsule   Oral   Take 1 capsule (500 mg total) by mouth 4 (four) times daily.   40 capsule   0   . HYDROcodone-acetaminophen (NORCO/VICODIN) 5-325 MG per tablet   Oral   Take 1 tablet by mouth every 4 (four) hours as needed for pain.   20 tablet   0    BP 143/81  Temp(Src) 98.1 F (36.7 C) (Oral)  Ht 5\' 10"  (1.778 m)  Wt 175 lb (79.379 kg)  BMI 25.11 kg/m2  SpO2 100% Physical Exam  Nursing note and vitals reviewed. Constitutional: He is oriented to person, place, and time. He appears well-developed and well-nourished.  Non-toxic appearance.  HENT:  Head: Normocephalic.  Right Ear: Tympanic membrane and external ear normal.  Left Ear: Tympanic membrane and external ear normal.  Mouth/Throat: Uvula is midline. Posterior oropharyngeal erythema present.  Eyes: EOM and lids are normal. Pupils are equal, round, and reactive to light.  Neck: Normal range of motion. Neck supple. Carotid bruit is not present.  Cardiovascular: Normal rate, regular rhythm, normal heart sounds, intact distal pulses and normal pulses.   Pulmonary/Chest: Breath sounds normal. No respiratory distress.  Abdominal: Soft. Bowel sounds are normal. There is no tenderness. There is no guarding.  Musculoskeletal: Normal range of motion.  Lymphadenopathy:       Head (right side): No submandibular adenopathy present.       Head (left side): No submandibular adenopathy present.    He has no cervical adenopathy.  Neurological: He is alert and oriented to person, place, and time. He has normal strength. No cranial nerve deficit or sensory deficit.  Skin: Skin is warm and dry.  Psychiatric: He has a normal mood and affect. His  speech is normal.    ED Course  Procedures (including critical care time) Labs Review Labs Reviewed - No data to display Imaging Review No results found.  EKG Interpretation   None       MDM   1. Pharyngitis    *I have reviewed nursing notes, vital signs, and all appropriate lab and imaging results for this patient.** Pulse oximetry 100% on room air. Within normal limits by my interpretation. Patient is afebrile at this time. Speech is clear and understandable. The plan at this time is for the patient to receive Amoxil and ibuprofen for his sore throat. Patient advised to use salt water gargles 3-4 times daily. He is also advised to use a mask until the symptoms have resolved. Patient to see his primary physician, or return to the emergency department if any changes, problems, or concerns.   Kathie Dike, PA-C 10/07/13 1455

## 2013-10-30 ENCOUNTER — Emergency Department (HOSPITAL_COMMUNITY): Payer: Self-pay

## 2013-10-30 ENCOUNTER — Encounter (HOSPITAL_COMMUNITY): Payer: Self-pay | Admitting: Emergency Medicine

## 2013-10-30 ENCOUNTER — Emergency Department (HOSPITAL_COMMUNITY)
Admission: EM | Admit: 2013-10-30 | Discharge: 2013-10-30 | Disposition: A | Discharge status: Home / Self Care | Payer: Self-pay | Attending: Emergency Medicine | Admitting: Emergency Medicine

## 2013-10-30 DIAGNOSIS — Y929 Unspecified place or not applicable: Secondary | ICD-10-CM | POA: Insufficient documentation

## 2013-10-30 DIAGNOSIS — W230XXA Caught, crushed, jammed, or pinched between moving objects, initial encounter: Secondary | ICD-10-CM | POA: Insufficient documentation

## 2013-10-30 DIAGNOSIS — Z8659 Personal history of other mental and behavioral disorders: Secondary | ICD-10-CM | POA: Insufficient documentation

## 2013-10-30 DIAGNOSIS — Y9389 Activity, other specified: Secondary | ICD-10-CM | POA: Insufficient documentation

## 2013-10-30 DIAGNOSIS — S92919A Unspecified fracture of unspecified toe(s), initial encounter for closed fracture: Secondary | ICD-10-CM | POA: Insufficient documentation

## 2013-10-30 DIAGNOSIS — S92425A Nondisplaced fracture of distal phalanx of left great toe, initial encounter for closed fracture: Secondary | ICD-10-CM

## 2013-10-30 MED ORDER — HYDROCODONE-ACETAMINOPHEN 5-325 MG PO TABS
2.0000 | ORAL_TABLET | Freq: Once | ORAL | Status: AC
  Administered 2013-10-30: 2 via ORAL
  Filled 2013-10-30: qty 2

## 2013-10-30 MED ORDER — HYDROCODONE-ACETAMINOPHEN 5-325 MG PO TABS: 1.0000 | ORAL_TABLET | ORAL | Status: DC | PRN

## 2013-10-30 NOTE — ED Provider Notes (Signed)
CSN: [id number]     Arrival date & time 10/30/13  1253 History   First MD Initiated Contact with Patient 10/30/13 1258     Chief Complaint  Patient presents with  . Toe Pain   (Consider location/radiation/quality/duration/timing/severity/associated sxs/prior Treatment) HPI Comments: Patient is a 22 year old male who presents to the emergency department complaining of left great toe pain after having a syncopal directly onto his toe last night. Patient states he was leaning over the sink when it came off the wall and onto his toe, is to immediately began to swell and appear bruised. Pain currently described as throbbing, constant, worse with pressure rated 10 out of 10. He try taking 2 Tylenol with minimal relief. Denies numbness or tingling.  Patient is a 22 y.o. male presenting with toe pain. The history is provided by the patient.  Toe Pain Pertinent negatives include no nausea or numbness.    Past Medical History  Diagnosis Date  . Depression   . Bipolar 1 disorder    Past Surgical History  Procedure Laterality Date  . Tonsillectomy     History reviewed. No pertinent family history. History  Substance Use Topics  . Smoking status: Never Smoker   . Smokeless tobacco: Current User    Types: Snuff  . Alcohol Use: Yes     Comment: ocassional    Review of Systems  Constitutional: Negative for activity change.  Gastrointestinal: Negative for nausea.  Musculoskeletal:       Positive for left great toe pain and swelling.  Neurological: Negative for numbness.  Hematological: Does not bruise/bleed easily.    Allergies  Cashews and Zoloft  Home Medications   Current Outpatient Rx  Name  Route  Sig  Dispense  Refill  . acetaminophen (TYLENOL) 500 MG tablet   Oral   Take 1,000 mg by mouth every 6 (six) hours as needed.         Marland Kitchen amoxicillin (AMOXIL) 500 MG capsule   Oral   Take 1 capsule (500 mg total) by mouth 3 (three) times daily.   21 capsule   0   . cephALEXin  (KEFLEX) 500 MG capsule   Oral   Take 1 capsule (500 mg total) by mouth 4 (four) times daily.   40 capsule   0   . HYDROcodone-acetaminophen (NORCO/VICODIN) 5-325 MG per tablet   Oral   Take 1 tablet by mouth every 4 (four) hours as needed for pain.   20 tablet   0   . HYDROcodone-acetaminophen (NORCO/VICODIN) 5-325 MG per tablet   Oral   Take 1-2 tablets by mouth every 4 (four) hours as needed.   10 tablet   0    BP 133/80  Pulse 78  Temp(Src) 97.9 F (36.6 C) (Oral)  Resp 16  Ht 5\' 10"  (1.778 m)  Wt 175 lb (79.379 kg)  BMI 25.11 kg/m2  SpO2 99% Physical Exam  Nursing note and vitals reviewed. Constitutional: He is oriented to person, place, and time. He appears well-developed and well-nourished. No distress.  HENT:  Head: Normocephalic and atraumatic.  Mouth/Throat: Oropharynx is clear and moist.  Eyes: Conjunctivae are normal.  Neck: Normal range of motion. Neck supple.  Cardiovascular: Normal rate, regular rhythm and normal heart sounds.   Capillary refill > 3 seconds.  Pulmonary/Chest: Effort normal and breath sounds normal.  Musculoskeletal:  TTP of left great toe. ROM limited due to pain. Swelling noted throughout toe, worse distally. Bruising at base of nail bed.  Neurological: He  is alert and oriented to person, place, and time.  Sensation intact.  Skin: Skin is warm and dry. He is not diaphoretic.  Psychiatric: He has a normal mood and affect. His behavior is normal.    ED Course  Procedures (including critical care time) Labs Review Labs Reviewed - No data to display Imaging Review Dg Toe Great Left  10/30/2013   EXAM: LEFT GREAT TOE  COMPARISON:  None.  FINDINGS: There is a vertical fracture of the base of the distal phalanx of the left great toe. Fracture is nondisplaced. Questionable radiopaque foreign body noted along the plantar aspect of the left great toe, clinical correlation is suggested. Question did this patient have an open injury.   IMPRESSION: 1. Nondisplaced fracture base of the distal phalanx of the left great toe. The fracture extends into the interphalangeal joint space. 2. Cannot exclude subtle foreign body in the plantar subcutaneous tissues of the left great toe.   Electronically Signed   By: Maisie Fus  Register   On: 10/30/2013 14:07    EKG Interpretation   None       MDM   1. Closed nondisplaced fracture of distal phalanx of left great toe, initial encounter    Neurovascularly intact. Post-op shoe and crutches given. RICE. F/u with ortho regarding possible fb, non-palpable. Return precautions given. Patient states understanding of treatment care plan and is agreeable.     Trevor Mace, PA-C 10/30/13 [phone]

## 2013-10-30 NOTE — ED Notes (Signed)
Injury to L great toe after dropping a sink on it last night.

## 2013-10-30 NOTE — ED Provider Notes (Signed)
Medical screening examination/treatment/procedure(s) were performed by non-physician practitioner and as supervising physician I was immediately available for consultation/collaboration.  EKG Interpretation   None         Layla Maw Ward, DO 10/30/13 1422

## 2014-01-06 ENCOUNTER — Emergency Department (HOSPITAL_COMMUNITY)
Admission: EM | Admit: 2014-01-06 | Discharge: 2014-01-06 | Disposition: A | Discharge status: Home / Self Care | Payer: Self-pay | Attending: Emergency Medicine | Admitting: Emergency Medicine

## 2014-01-06 ENCOUNTER — Encounter (HOSPITAL_COMMUNITY): Payer: Self-pay | Admitting: Emergency Medicine

## 2014-01-06 DIAGNOSIS — R600 Localized edema: Secondary | ICD-10-CM

## 2014-01-06 DIAGNOSIS — Z792 Long term (current) use of antibiotics: Secondary | ICD-10-CM | POA: Insufficient documentation

## 2014-01-06 DIAGNOSIS — Z8659 Personal history of other mental and behavioral disorders: Secondary | ICD-10-CM | POA: Insufficient documentation

## 2014-01-06 DIAGNOSIS — R609 Edema, unspecified: Secondary | ICD-10-CM | POA: Insufficient documentation

## 2014-01-06 NOTE — ED Provider Notes (Signed)
CSN: [id number]     Arrival date & time 01/06/14  1704 History  This chart was scribed for non-physician practitioner, [name], PA-C,working with Glynn Octave, MD, by Karle Plumber, ED Scribe.  This patient was seen in room APFT21/APFT21 and the patient's care was started at 5:36 PM.  Chief Complaint  Patient presents with  . Leg Swelling   The history is provided by the patient. No language interpreter was used.   HPI Comments:  Joshua Stone is a 23 y.o. male who presents to the Emergency Department complaining of worsening RLE swelling on the medial side below his knee. He states he got his leg caught in some barbed wire approximately one month ago and received lacerations and abrasions. Pt has some superficial abrasions that he states are still present from the incident. He reports a prominent hematoma at the time of the incident that has now resolved. He states he has no pain and reports decreased sensation at the site of the swelling which has become larger over the past 2 weeks.  Past Medical History  Diagnosis Date  . Depression   . Bipolar 1 disorder    Past Surgical History  Procedure Laterality Date  . Tonsillectomy     No family history on file. History  Substance Use Topics  . Smoking status: Never Smoker   . Smokeless tobacco: Current User    Types: Snuff  . Alcohol Use: Yes     Comment: ocassional    Review of Systems  Constitutional: Negative for fever and chills.  Respiratory: Negative for cough, chest tightness and shortness of breath.   Musculoskeletal: Negative for arthralgias, gait problem, joint swelling and myalgias.       Small area of leg swelling to medial aspect below right knee.  Skin: Positive for wound (Superficial abrasions to RLE.). Negative for color change and rash.  Neurological: Negative for weakness and numbness.    Allergies  Cashews and Zoloft  Home Medications   Current Outpatient Rx  Name  Route  Sig  Dispense  Refill  .  acetaminophen (TYLENOL) 500 MG tablet   Oral   Take 1,000 mg by mouth every 6 (six) hours as needed.         Marland Kitchen amoxicillin (AMOXIL) 500 MG capsule   Oral   Take 1 capsule (500 mg total) by mouth 3 (three) times daily.   21 capsule   0   . cephALEXin (KEFLEX) 500 MG capsule   Oral   Take 1 capsule (500 mg total) by mouth 4 (four) times daily.   40 capsule   0   . HYDROcodone-acetaminophen (NORCO/VICODIN) 5-325 MG per tablet   Oral   Take 1 tablet by mouth every 4 (four) hours as needed for pain.   20 tablet   0   . HYDROcodone-acetaminophen (NORCO/VICODIN) 5-325 MG per tablet   Oral   Take 1-2 tablets by mouth every 4 (four) hours as needed.   10 tablet   0    Triage Vitals: BP 144/83  Pulse 75  Temp(Src) 98.4 F (36.9 C) (Oral)  Resp 24  Ht 5\' 10"  (1.778 m)  Wt 180 lb (81.647 kg)  BMI 25.83 kg/m2  SpO2 100% Physical Exam  Constitutional: He appears well-developed and well-nourished.  HENT:  Head: Atraumatic.  Neck: Normal range of motion.  Cardiovascular:  Pulses equal bilaterally  Musculoskeletal: Normal range of motion. He exhibits edema. He exhibits no tenderness.  Localized soft edema right medial upper calf approx  4 cm diameter,  Visibly raised area, no erythema, no induration.   Neurological: He is alert. He has normal strength. He displays normal reflexes. No sensory deficit.  Equal strength  Skin: Skin is warm and dry.  Psychiatric: He has a normal mood and affect.    ED Course  Procedures (including critical care time) DIAGNOSTIC STUDIES: Oxygen Saturation is 100% on RA, normal by my interpretation.   COORDINATION OF CARE: 5:41 PM- Will perform a bedside ultrasound and consult with Dr. Manus Gunningancour on appropriate course of treatment. Pt verbalizes understanding and agrees to plan.  Informal US revealing for small fluid collection medial upper calf.    Medications - No data to display  Labs Review Labs Reviewed - No data to display Imaging  Review No results found.  EKG Interpretation   None       MDM   1. Leg edema, right    Pt also seen by Dr Manus Gunningancour, who performed bedside US.  Suspicious for localized seroma vs hematoma.  Pt was scheduled for outpatient US to assess for this vs bakers cyst,  Doubt dvt.    I personally performed the services described in this documentation, which was scribed in my presence. The recorded information has been reviewed and is accurate.    Burgess AmorJulie Ermin Parisien, PA-C 01/06/14 1925

## 2014-01-06 NOTE — ED Notes (Signed)
Pt c/o swelling to inside of right knee area that started about a month ago, states that he first noticed the area after getting his leg caught in some wire a month ago, cms intact distal

## 2014-01-07 ENCOUNTER — Ambulatory Visit (HOSPITAL_COMMUNITY)
Admit: 2014-01-07 | Discharge: 2014-01-07 | Disposition: A | Discharge status: Home / Self Care | Payer: Self-pay | Attending: Emergency Medicine | Admitting: Emergency Medicine

## 2014-01-07 ENCOUNTER — Other Ambulatory Visit (HOSPITAL_COMMUNITY): Payer: Self-pay | Admitting: Emergency Medicine

## 2014-01-07 DIAGNOSIS — R229 Localized swelling, mass and lump, unspecified: Secondary | ICD-10-CM | POA: Insufficient documentation

## 2014-01-07 DIAGNOSIS — R600 Localized edema: Secondary | ICD-10-CM

## 2014-01-07 DIAGNOSIS — R609 Edema, unspecified: Secondary | ICD-10-CM | POA: Insufficient documentation

## 2014-01-07 NOTE — ED Provider Notes (Signed)
Medical screening examination/treatment/procedure(s) were conducted as a shared visit with non-physician practitioner(s) and myself.  I personally evaluated the patient during the encounter.  Swelling below R knee x 2 months.  No erythema, chest pain, SOB.  No fever. +2 DP and PT pulses, no calf tenderness. Compartments soft.  No fluctuance or erythema. Suspect seroma v hematoma.  No evidence of abscess.  EKG Interpretation   None       EMERGENCY DEPARTMENT US SOFT TISSUE INTERPRETATION "Study: Limited Ultrasound of the noted body part in comments below"  INDICATIONS: Pain Multiple views of the body part are obtained with a multi-frequency linear probe  PERFORMED BY:  Myself  IMAGES ARCHIVED?: Yes  SIDE:Right   BODY PART:Lower extremity  FINDINGS: No abcess noted and Cellulitis absent  LIMITATIONS:  Body Habitus and Emergent Procedure  INTERPRETATION:  No abcess noted and No cellulitis noted  COMMENT:  Fluid collection, likely seroma    Glynn OctaveStephen Teyah Rossy, MD 01/07/14 [phone]

## 2014-01-07 NOTE — ED Provider Notes (Signed)
[place] shows no dvt,  Most likely hematoma.  Will refer to Dr. Fuller CanadaStanley Harrison  Benny LennertJoseph L Chamya Hunton, MD 01/07/14 [phone]

## 2014-02-13 ENCOUNTER — Encounter (HOSPITAL_COMMUNITY): Payer: Self-pay | Admitting: Emergency Medicine

## 2014-02-13 ENCOUNTER — Emergency Department (HOSPITAL_COMMUNITY)
Admission: EM | Admit: 2014-02-13 | Discharge: 2014-02-13 | Disposition: A | Discharge status: Home / Self Care | Payer: Self-pay | Attending: Emergency Medicine | Admitting: Emergency Medicine

## 2014-02-13 DIAGNOSIS — Z792 Long term (current) use of antibiotics: Secondary | ICD-10-CM | POA: Insufficient documentation

## 2014-02-13 DIAGNOSIS — Z9089 Acquired absence of other organs: Secondary | ICD-10-CM | POA: Insufficient documentation

## 2014-02-13 DIAGNOSIS — Z8659 Personal history of other mental and behavioral disorders: Secondary | ICD-10-CM | POA: Insufficient documentation

## 2014-02-13 DIAGNOSIS — F17200 Nicotine dependence, unspecified, uncomplicated: Secondary | ICD-10-CM | POA: Insufficient documentation

## 2014-02-13 DIAGNOSIS — J029 Acute pharyngitis, unspecified: Secondary | ICD-10-CM | POA: Insufficient documentation

## 2014-02-13 MED ORDER — MAGIC MOUTHWASH W/LIDOCAINE: 5.0000 mL | Freq: Three times a day (TID) | ORAL | Status: DC | PRN

## 2014-02-13 MED ORDER — IBUPROFEN 800 MG PO TABS: 800.0000 mg | ORAL_TABLET | Freq: Three times a day (TID) | ORAL | Status: DC

## 2014-02-13 MED ORDER — AMOXICILLIN 500 MG PO CAPS: 500.0000 mg | ORAL_CAPSULE | Freq: Three times a day (TID) | ORAL | Status: DC

## 2014-02-13 NOTE — ED Notes (Signed)
Patient states that he has had a soret throat x 3 days.

## 2014-02-13 NOTE — Discharge Instructions (Signed)
Pharyngitis °Pharyngitis is a sore throat (pharynx). There is redness, pain, and swelling of your throat. °HOME CARE  °· Drink enough fluids to keep your pee (urine) clear or pale yellow. °· Only take medicine as told by your doctor. °· You may get sick again if you do not take medicine as told. Finish your medicines, even if you start to feel better. °· Do not take aspirin. °· Rest. °· Rinse your mouth (gargle) with salt water (½ tsp of salt per 1 qt of water) every 1 2 hours. This will help the pain. °· If you are not at risk for choking, you can suck on hard candy or sore throat lozenges. °GET HELP IF: °· You have large, tender lumps on your neck. °· You have a rash. °· You cough up green, yellow-brown, or bloody spit. °GET HELP RIGHT AWAY IF:  °· You have a stiff neck. °· You drool or cannot swallow liquids. °· You throw up (vomit) or are not able to keep medicine or liquids down. °· You have very bad pain that does not go away with medicine. °· You have problems breathing (not from a stuffy nose). °MAKE SURE YOU:  °· Understand these instructions. °· Will watch your condition. °· Will get help right away if you are not doing well or get worse. °Document Released: 05/03/2008 Document Revised: 09/05/2013 Document Reviewed: 07/23/2013 °[employer]® Patient Information ©2014 [employer]. ° °

## 2014-02-16 NOTE — ED Provider Notes (Signed)
CSN: [id number]     Arrival date & time 02/13/14  1746 History   First MD Initiated Contact with Patient 02/13/14 1920     Chief Complaint  Patient presents with  . Sore Throat     (Consider location/radiation/quality/duration/timing/severity/associated sxs/prior Treatment) Patient is a 23 y.o. male presenting with pharyngitis. The history is provided by the patient.  Sore Throat This is a new problem. Episode onset: 3 days. The problem occurs constantly. The problem has been unchanged. Associated symptoms include a sore throat. Pertinent negatives include no abdominal pain, arthralgias, chills, congestion, coughing, fever, headaches, joint swelling, nausea, neck pain, numbness, rash, swollen glands, visual change, vomiting or weakness. The symptoms are aggravated by swallowing. He has tried acetaminophen for the symptoms. The treatment provided no relief.    Past Medical History  Diagnosis Date  . Depression   . Bipolar 1 disorder    Past Surgical History  Procedure Laterality Date  . Tonsillectomy     No family history on file. History  Substance Use Topics  . Smoking status: Current Every Day Smoker  . Smokeless tobacco: Current User    Types: Snuff  . Alcohol Use: Yes     Comment: ocassional    Review of Systems  Constitutional: Negative for fever, chills, activity change and appetite change.  HENT: Positive for rhinorrhea and sore throat. Negative for congestion, ear pain, facial swelling, trouble swallowing and voice change.   Eyes: Negative for pain and visual disturbance.  Respiratory: Negative for cough, shortness of breath, wheezing and stridor.   Gastrointestinal: Negative for nausea, vomiting and abdominal pain.  Musculoskeletal: Negative for arthralgias, joint swelling, neck pain and neck stiffness.  Skin: Negative.  Negative for color change and rash.  Neurological: Negative for dizziness, facial asymmetry, speech difficulty, weakness, numbness and headaches.   Hematological: Negative for adenopathy.  Psychiatric/Behavioral: Negative for confusion.  All other systems reviewed and are negative.      Allergies  Cashews and Zoloft  Home Medications   Current Outpatient Rx  Name  Route  Sig  Dispense  Refill  . acetaminophen (TYLENOL) 500 MG tablet   Oral   Take 1,000 mg by mouth every 6 (six) hours as needed.         . Alum & Mag Hydroxide-Simeth (MAGIC MOUTHWASH W/LIDOCAINE) SOLN   Oral   Take 5 mLs by mouth 3 (three) times daily as needed for mouth pain.   60 mL   0   . amoxicillin (AMOXIL) 500 MG capsule   Oral   Take 1 capsule (500 mg total) by mouth 3 (three) times daily.   21 capsule   0   . amoxicillin (AMOXIL) 500 MG capsule   Oral   Take 1 capsule (500 mg total) by mouth 3 (three) times daily. For 10 days   30 capsule   0   . cephALEXin (KEFLEX) 500 MG capsule   Oral   Take 1 capsule (500 mg total) by mouth 4 (four) times daily.   40 capsule   0   . HYDROcodone-acetaminophen (NORCO/VICODIN) 5-325 MG per tablet   Oral   Take 1 tablet by mouth every 4 (four) hours as needed for pain.   20 tablet   0   . HYDROcodone-acetaminophen (NORCO/VICODIN) 5-325 MG per tablet   Oral   Take 1-2 tablets by mouth every 4 (four) hours as needed.   10 tablet   0   . ibuprofen (ADVIL,MOTRIN) 800 MG tablet   Oral  Take 1 tablet (800 mg total) by mouth 3 (three) times daily.   21 tablet   0    BP 151/86  Pulse 71  Temp(Src) 98.3 F (36.8 C) (Oral)  Resp 18  Ht 5\' 10"  (1.778 m)  Wt 175 lb (79.379 kg)  BMI 25.11 kg/m2  SpO2 100% Physical Exam  Nursing note and vitals reviewed. Constitutional: He is oriented to person, place, and time. He appears well-developed and well-nourished. No distress.  HENT:  Head: Normocephalic and atraumatic.  Right Ear: Tympanic membrane and ear canal normal.  Left Ear: Tympanic membrane and ear canal normal.  Mouth/Throat: Uvula is midline and mucous membranes are normal. No  trismus in the jaw. No uvula swelling. Posterior oropharyngeal edema and posterior oropharyngeal erythema present. No tonsillar abscesses.  Neck: Normal range of motion. Neck supple.  Cardiovascular: Normal rate, regular rhythm, normal heart sounds and intact distal pulses.   No murmur heard. Pulmonary/Chest: Effort normal and breath sounds normal. No respiratory distress.  Abdominal: Soft. He exhibits no distension. There is no splenomegaly. There is no tenderness. There is no rebound and no guarding.  Musculoskeletal: Normal range of motion.  Lymphadenopathy:    He has no cervical adenopathy.  Neurological: He is alert and oriented to person, place, and time. He exhibits normal muscle tone. Coordination normal.  Skin: Skin is warm and dry.    ED Course  Procedures (including critical care time) Labs Review Labs Reviewed - No data to display Imaging Review No results found.   EKG Interpretation None      MDM   Final diagnoses:  Pharyngitis    Patient is well appearing, no concerning evidence for PTA or strep.  VSS.  Airway patent.  Pt agrees to f/u with PMD or to return here if needed.  The patient appears reasonably screened and/or stabilized for discharge and I doubt any other medical condition or other [facility] requiring further screening, evaluation, or treatment in the ED at this time prior to discharge.     Deward Sebek L. Nashay Brickley, PA-C 02/16/14 1204

## 2014-02-17 NOTE — ED Provider Notes (Signed)
Medical screening examination/treatment/procedure(s) were performed by non-physician practitioner and as supervising physician I was immediately available for consultation/collaboration.   EKG Interpretation None       Lucca Ballo, MD 02/17/14 0822 

## 2014-09-01 ENCOUNTER — Emergency Department (HOSPITAL_COMMUNITY)
Admission: EM | Admit: 2014-09-01 | Discharge: 2014-09-01 | Disposition: A | Discharge status: Home / Self Care | Payer: Self-pay | Attending: Emergency Medicine | Admitting: Emergency Medicine

## 2014-09-01 ENCOUNTER — Encounter (HOSPITAL_COMMUNITY): Payer: Self-pay | Admitting: Emergency Medicine

## 2014-09-01 DIAGNOSIS — Z87891 Personal history of nicotine dependence: Secondary | ICD-10-CM | POA: Insufficient documentation

## 2014-09-01 DIAGNOSIS — R238 Other skin changes: Secondary | ICD-10-CM

## 2014-09-01 DIAGNOSIS — Z792 Long term (current) use of antibiotics: Secondary | ICD-10-CM | POA: Insufficient documentation

## 2014-09-01 DIAGNOSIS — Z8659 Personal history of other mental and behavioral disorders: Secondary | ICD-10-CM | POA: Insufficient documentation

## 2014-09-01 DIAGNOSIS — M7981 Nontraumatic hematoma of soft tissue: Secondary | ICD-10-CM | POA: Insufficient documentation

## 2014-09-01 MED ORDER — BACITRACIN-NEOMYCIN-POLYMYXIN 400-5-5000 EX OINT
TOPICAL_OINTMENT | Freq: Once | CUTANEOUS | Status: AC
  Administered 2014-09-01: 1 via TOPICAL
  Filled 2014-09-01: qty 1

## 2014-09-01 MED ORDER — IBUPROFEN 600 MG PO TABS: 600.0000 mg | ORAL_TABLET | Freq: Four times a day (QID) | ORAL | Status: DC | PRN

## 2014-09-01 NOTE — Discharge Instructions (Signed)
Blisters Blisters are fluid-filled sacs that form within the skin. Common causes of blistering are friction, burns, and exposure to irritating chemicals. The fluid in the blister protects the underlying damaged skin. Most of the time it is not recommended that you open blisters. When a blister is opened, there is an increased chance for infection. Usually, a blister will open on its own. They then dry up and peel off within 10 days. If the blister is tense and uncomfortable (painful) the fluid may be drained. If it is drained the roof of the blister should be left intact. The draining should only be done by a medical professional under aseptic conditions. Poorly fitting shoes and boots can cause blisters by being too tight or too loose. Wearing extra socks or using tape, bandages, or pads over the blister-prone area helps prevent the problem by reducing friction. Blisters heal more slowly if you have diabetes or if you have problems with your circulation. You need to be careful about medical follow-up to prevent infection. HOME CARE INSTRUCTIONS  Protect areas where blisters have formed until the skin is healed. Use a special bandage with a hole cut in the middle around the blister. This reduces pressure and friction. When the blister breaks, trim off the loose skin and keep the area clean by washing it with soap daily. Soaking the blister or broken-open blister with diluted vinegar twice daily for 15 minutes will dry it up and speed the healing. Use 3 tablespoons of white vinegar per quart of water (45 mL white vinegar per liter of water). An antibiotic ointment and a bandage can be used to cover the area after soaking.  SEEK MEDICAL CARE IF:   You develop increased redness, pain, swelling, or drainage in the blistered area.  You develop a pus-like discharge from the blistered area, chills, or a fever. MAKE SURE YOU:   Understand these instructions.  Will watch your condition.  Will get help right  away if you are not doing well or get worse. Document Released: 12/23/2004 Document Revised: 02/07/2012 Document Reviewed: 11/20/2008 [facility] Patient Information 2015 [place], [place]. This information is not intended to replace advice given to you by your health care provider. Make sure you discuss any questions you have with your health care provider.   Emergency Department Resource Guide 1) Find a Doctor and Pay Out of Pocket Although you won't have to find out who is covered by your insurance plan, it is a good idea to ask around and get recommendations. You will then need to call the office and see if the doctor you have chosen will accept you as a new patient and what types of options they offer for patients who are self-pay. Some doctors offer discounts or will set up payment plans for their patients who do not have insurance, but you will need to ask so you aren't surprised when you get to your appointment.  2) Contact Your Local Health Department Not all health departments have doctors that can see patients for sick visits, but many do, so it is worth a call to see if yours does. If you don't know where your local health department is, you can check in your phone book. The CDC also has a tool to help you locate your state's health department, and many state websites also have listings of all of their local health departments.  3) Find a Walk-in Clinic If your illness is not likely to be very severe or complicated, you may want to  try a walk in clinic. These are popping up all over the country in pharmacies, drugstores, and shopping centers. They're usually staffed by nurse practitioners or physician assistants that have been trained to treat common illnesses and complaints. They're usually fairly quick and inexpensive. However, if you have serious medical issues or chronic medical problems, these are probably not your best option.  No Primary Care Doctor: - Call Health Connect at  [phone] -  they can help you locate a primary care doctor that  accepts your insurance, provides certain services, etc. - Physician Referral Service- [phone]  Chronic Pain Problems: Organization         Address  Phone   Notes  [facility]  [phone] Patients need to be referred by their primary care doctor.   Medication Assistance: Organization         Address  Phone   Notes  [facility] Medication [facility] [address] [place], [place] [address] [phone]) [phone] --Must be a resident of [facility] -- Must have NO insurance coverage whatsoever (no Medicaid/ Medicare, etc.) -- The pt. MUST have a primary care doctor that directs their care regularly and follows them in the community   MedAssist  [phone]   [employer]  [phone]    Agencies that provide inexpensive medical care: Organization         Address  Phone   Notes  [facility]  [phone]) [phone]   [facility]    [phone]   [facility] [address] [place], [place] [address] [phone]) [phone]   [facility] [address], [place] [phone]) [phone]   Planned Parenthood    [phone]) [phone]   [facility]    [phone]   [facility]  [address] Phone:  [phone]) [phone], Fax:  [phone] Hours of Operation:  9 am - 6 pm, M-F.  Also accepts Medicaid/Medicare and self-pay.  [facility]  [address], [place] Phone: [phone](336) [phone], Fax: [phone]. Hours of Operation:  8:30 am - 5:30 pm, M-F.  Also accepts Medicaid and self-pay.  [facility] [place] [address], [place] Phone: [phone]) [phone]   [facility] [address], [place], [place] [phone], Ext. [phone] Mondays & Thursdays: 7-9 AM.  First 15 patients are seen on a first come, first serve basis.    Medicaid-accepting  [facility] Providers:  Organization         Address  Phone   Notes  [facility] [address], [place] [phone]) [phone] Also accepts self-pay patients.  [facility] [address], [place]  [phone]) [phone]   [facility] [address], [place] [phone]) [phone]   [facility] [address] [place] [address], [place] [phone]) [phone]   Renaye RakersVeita Bland [address], [place]   [phone]) [phone] Only accepts [place] Access [place] patients after they have their name applied to their card.   Self-Pay (no insurance) in [facility] [place]:  Organization         Address  Phone   Notes  Sickle Cell Patients, [facility] [address], [place] [phone]) [phone]   [facility] [address], [place] [phone]) [phone]  [facility] [place]  [address], [place] [phone]   [facility]/Dr. Osei-Bonsu  [address] [place] [address], [place] or [address], [place] [phone] Phone number for both [place] and [place] locations is the same.  [facility] [address], [place] [phone]   [facility] [address] [place] [address], [place] or [address] [phone]) [phone]   [facility] [address], [place] [phone]) [phone], phone; [phone], fax Sees patients 1st and 3rd Saturday of every month.  Must not qualify for public or private insurance (i.e. Medicaid, Medicare, [place] Health Choice, Veterans' Benefits)  Household income should be no more than 200% of the poverty level The clinic cannot treat you if you are pregnant or think you are pregnant  Sexually transmitted diseases are not treated at the clinic.    Dental Care: Organization         Address  Phone  Notes  [facility] Department of [facility]  Health [facility] [address], [place] [phone]) [phone] Accepts children up to age 24 who are enrolled in [place]icaid or [place] Health Choice; pregnant women with a Medicaid card; and children who have applied for Medicaid or [place] Health Choice, but were declined, whose parents can pay a reduced fee at time of service.  [facility] Department of [facility] [place]  [address], [place] [phone]) [phone] Accepts children up to age 24 who are enrolled in [place]icaid or [place] Health Choice; pregnant women with a Medicaid card; and children who have applied for Medicaid or [place] Health Choice, but were declined, whose parents can pay a reduced fee at time of service.  [facility]  [address], [place] [phone] Patients are seen by appointment only. Walk-ins are not accepted. [facility] will see patients 24 years of age and older. Monday - Tuesday (8am-5pm) Most Wednesdays (8:30-5pm) $30 per visit, cash only  [facility]  [address], [facility] [phone] Patients are seen by appointment only. Walk-ins are not accepted. [facility] will see patients 24 years of age and older. One Wednesday Evening (Monthly: Volunteer Based).  $30 per visit, cash only  [employer] [facility] [facility]  [phone]) [phone] for adults; Children under age 444, call Graduate Pediatric Dentistry at [phone]. Children aged 344-14, please call [phone]) [phone] to request a pediatric application.  Dental services are provided in all areas of dental care including fillings, crowns and bridges, complete and partial dentures, implants, gum treatment, root canals, and extractions. Preventive care is also provided. Treatment is provided to both adults and children. Patients are selected via a lottery and there is often a waiting list.   [facility] [address], [place]  [phone]) [phone] [email or web address]   [facility] [address], [place], [place] [phone], Ext. [phone] Second and Fourth Thursday of each month, opens at 6:30 AM; Clinic ends at 9 AM.  Patients are seen on a first-come first-served basis, and a limited number are seen during each clinic.   [facility]  [address], [place], [place] [phone]) [phone]   Eligibility Requirements You must have lived in [place], [place], or [place] counties for at least the last three months.   You cannot be eligible for state or federal sponsored  healthcare insurance, including CIGNAVeterans Administration, [place]icaid, or Harrah[employer] EntertainmentMedicare.   You generally cannot be eligible for healthcare insurance through your employer.    How to apply: Eligibility screenings are held every Tuesday and Wednesday afternoon from 1:00 pm until 4:00 pm. You do not need an appointment for the interview!  [facility] [address], [place], [place] [phone]   [facility]  [phone]   [facility]  [phone]   [facility]  [phone]    Behavioral Health Resources in the Community: Intensive Outpatient Programs Organization         Address  Phone  Notes  [facility] [address], [place], [place] [phone]   [facility] [address], [place], [place] [phone]   [facility] [address], [place], [place]  [phone]   [facility] [address],  [place], [place] [phone] or [phone]   Substance Abuse Resources Organization         Address  Phone  Notes  [facility]  [phone]   [facility]  [phone]   [facility]  [phone]   [facility]  [phone]   Residential & Outpatient Substance Abuse Program  [phone]   Psychological Services Organization         Address  Phone  Notes  [facility]   [phone]- [phone]   [facility]  [phone]- [phone]   [facility] [address], [place] [phone] or [phone]    Mobile Crisis Teams Organization         Address  Phone  Notes  [facility], Mobile Crisis Care Unit  [phone]   [facility]  [address] [place], [place] [phone]   Doristine LocksSharon DeEsch [address] [place] [phone]    Self-Help/Support Groups Organization         Address  Phone             Notes  [facility] [place] - variety of support groups  336- [phone] Call for more information  Narcotics Anonymous (NA), [facility]aring [facility] [address], [place]  2 meetings at this location   Statisticianesidential Treatment Programs Organization         Address  Phone  Notes  [facility] [address],    [place]  [phone]   [facility]  [address], [place] [phone], [place], [place] [phone]   [facility] [address], [place] [phone] Admissions: 8am-3pm M-F  [facility] [address]    [place], [place] [phone]   [facility] [address], [place], [place] [phone]   [facility] [address]  [place], [place] [phone]   [facility] - Intensive Outpatient [address], [place], [place] [phone]   [facility] ([facility] [address]  [place], [place] [phone] or [phone]   [facility] (RTS) [address] [place], [place] [phone] Accepts Medicaid  [facility] [address]  [place] [phone] Substance Abuse/Addiction Treatment   [facility] Behavioral Health Resources Organization         Address  Phone  Notes  [facility]  [phone]) [phone]   Angie FavaJulie Brannon, PhD [address] [place], [place]   [phone](336) [phone] or  [phone]   [facility]   [address] [place], [place] [phone]   [facility] [address], [place], [place] [phone] Insurance/Medicaid/sponsorship through [facility] and Families [address]                                    [place], [place] [phone] Therapy/tele-psych/case  [facility] [address]   [place], [place] [phone]    Dr. Lolly Mustache  [phone]   [facility]  [employer] [facility] Dept. 1) [address], [place] 2) [address], [place] 3)  [address], [place] [phone]  [phone]   [facility] Child Abuse Hotline [phone] or 902-604-8097 (After Hours)        Keep your wounds covered and apply an antibiotic ointment twice daily after mild soap and water wash.  Keep them covered, bulky dressing to avoid continued injury at work.

## 2014-09-01 NOTE — ED Provider Notes (Signed)
Medical screening examination/treatment/procedure(s) were conducted as a shared visit with non-physician practitioner(s) and myself.  I personally evaluated the patient during the encounter.   EKG Interpretation None       Patient seen by me as a shared visit. Patient with bilateral open sores or blisters to the ulnar side of both palms. Stated that occurred from work and lifting. They are oval in shape measuring about 5 mm in size. Some erythema seems to be tenderness. The one on the right thumb may be a little insensate. But the pulp of the from his had good sensation. No evidence of any deep space infection no soft tissue finger. No evidence of tenosynovium I. this. Cap refill is 1 second. These can be treated with antibiotic ointment Band-Aids and probably taping to protect him at work and gloves. Probably no referral for followup required. Patient's tetanus is up to date had one 2 years ago. Patient would prefer to keep working. Patient states that these were originally blisters in the TM Windell MouldingRuth them in the last couple days.  Vanetta MuldersScott Khyron Garno, MD 09/01/14 2055
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
## 2014-09-01 NOTE — ED Notes (Signed)
Patient with no complaints at this time. Respirations even and unlabored. Skin warm/dry. Discharge instructions reviewed with patient at this time. Patient given opportunity to voice concerns/ask questions. Patient discharged at this time and left Emergency Department with steady gait.   

## 2014-09-03 ENCOUNTER — Emergency Department (HOSPITAL_COMMUNITY)
Admission: EM | Admit: 2014-09-03 | Discharge: 2014-09-04 | Disposition: A | Discharge status: Home / Self Care | Payer: Self-pay | Attending: Emergency Medicine | Admitting: Emergency Medicine

## 2014-09-03 ENCOUNTER — Encounter (HOSPITAL_COMMUNITY): Payer: Self-pay | Admitting: Emergency Medicine

## 2014-09-03 DIAGNOSIS — Z791 Long term (current) use of non-steroidal anti-inflammatories (NSAID): Secondary | ICD-10-CM | POA: Insufficient documentation

## 2014-09-03 DIAGNOSIS — L98499 Non-pressure chronic ulcer of skin of other sites with unspecified severity: Secondary | ICD-10-CM | POA: Insufficient documentation

## 2014-09-03 DIAGNOSIS — Z8659 Personal history of other mental and behavioral disorders: Secondary | ICD-10-CM | POA: Insufficient documentation

## 2014-09-03 DIAGNOSIS — Z87891 Personal history of nicotine dependence: Secondary | ICD-10-CM | POA: Insufficient documentation

## 2014-09-03 DIAGNOSIS — T798XXD Other early complications of trauma, subsequent encounter: Secondary | ICD-10-CM

## 2014-09-03 NOTE — ED Notes (Signed)
Blistered on my fingers on Thursday, and now they are infected per pt.

## 2014-09-04 MED ORDER — CEPHALEXIN 500 MG PO CAPS: 500.0000 mg | ORAL_CAPSULE | Freq: Four times a day (QID) | ORAL | Status: DC

## 2014-09-04 MED ORDER — CEPHALEXIN 500 MG PO CAPS
500.0000 mg | ORAL_CAPSULE | Freq: Once | ORAL | Status: AC
  Administered 2014-09-04: 500 mg via ORAL
  Filled 2014-09-04: qty 1

## 2014-09-04 NOTE — ED Provider Notes (Signed)
CSN: [id number]     Arrival date & time 09/03/14  2252 History  This chart was scribed for Flint MelterElliott L Shenekia Riess, MD by Bronson CurbJacqueline Melvin, ED Scribe. This patient was seen in room APA05/APA05 and the patient's care was started at 12:16 AM.     Chief Complaint  Patient presents with  . Wound Infection     The history is provided by the patient. No language interpreter was used.    HPI Comments: [place] Bohle is a 23 y.o. male who presents to the Emergency Department for wound infection of blisters on his bilateral thumbs that appeared 5 days ago. Patient reports that he works at [employer], moving boxes, and states they have since become infected as a result. There is associated redness and tenderness. Patient was evaluated 3 days ago, however, no ABX were given and states the blisters have gotten worse since this visit.   Past Medical History  Diagnosis Date  . Depression   . Bipolar 1 disorder    Past Surgical History  Procedure Laterality Date  . Tonsillectomy     No family history on file. History  Substance Use Topics  . Smoking status: Former Games developermoker  . Smokeless tobacco: Current User    Types: Snuff  . Alcohol Use: No    Review of Systems  Skin: Positive for wound (blisters).  All other systems reviewed and are negative.     Allergies  Cashews and Zoloft  Home Medications   Prior to Admission medications   Medication Sig Start Date End Date Taking? Authorizing Provider  cephALEXin (KEFLEX) 500 MG capsule Take 1 capsule (500 mg total) by mouth 4 (four) times daily. 09/04/14   Flint MelterElliott L Bernadetta Roell, MD  ibuprofen (ADVIL,MOTRIN) 600 MG tablet Take 1 tablet (600 mg total) by mouth every 6 (six) hours as needed for moderate pain. 09/01/14   Burgess AmorJulie Idol, PA-C   Triage Vitals: BP 157/92  Pulse 80  Temp(Src) 98.2 F (36.8 C) (Oral)  Resp 16  Ht 5\' 10"  (1.778 m)  Wt 180 lb (81.647 kg)  BMI 25.83 kg/m2  SpO2 99%  Physical Exam  Nursing note and vitals reviewed. Constitutional: He  is oriented to person, place, and time. He appears well-developed and well-nourished.  HENT:  Head: Normocephalic and atraumatic.  Right Ear: External ear normal.  Left Ear: External ear normal.  Eyes: Conjunctivae and EOM are normal.  Neck: Normal range of motion and phonation normal.  Cardiovascular: Normal rate and regular rhythm.   Pulmonary/Chest: Effort normal and breath sounds normal. He exhibits no bony tenderness.  Musculoskeletal: Normal range of motion.  Neurological: He is alert and oriented to person, place, and time. No sensory deficit.  Skin: Skin is warm, dry and intact.  Both thumbs have supercfical ulcers of the tip with erythema beneath them. No deformity or limitation of motion. No proximal streaking.   Psychiatric: He has a normal mood and affect. His behavior is normal. Judgment and thought content normal.    ED Course  Procedures (including critical care time)  DIAGNOSTIC STUDIES: Oxygen Saturation is 99% on room air, normal by my interpretation.    COORDINATION OF CARE: At 0018 Discussed treatment plan with patient which includes ABX. Patient agrees.   Labs Review Labs Reviewed - No data to display  Imaging Review No results found.   EKG Interpretation None      MDM   Final diagnoses:  Wound infection, subsequent encounter    Wound infection pounds, localized no indication for  progressive, proximally spreading disease.  Nursing Notes Reviewed/ Care Coordinated Applicable Imaging Reviewed Interpretation of Laboratory Data incorporated into ED treatment  The patient appears reasonably screened and/or stabilized for discharge and I doubt any other medical condition or other [facility] requiring further screening, evaluation, or treatment in the ED at this time prior to discharge.  Plan: Home Medications- Bactrim; Home Treatments- warm soaks,  rest; return here if the recommended treatment, does not improve the symptoms; Recommended follow up- PCP  prn  I personally performed the services described in this documentation, which was scribed in my presence. The recorded information has been reviewed and is accurate.     Flint Melter, MD 09/04/14 [phone]

## 2014-09-04 NOTE — Discharge Instructions (Signed)
°  Soaks, both thumbs in warm water 3 times a day for 30 minutes. Wash the wounds well with soap and water 3 times each day. Return here if needed for problems or, if you're unable to work in 4 days from now.    Wound Infection A wound infection happens when a type of germ (bacteria) starts growing in the wound. In some cases, this can cause the wound to break open. If cared for properly, the infected wound will heal from the inside to the outside. Wound infections need treatment. CAUSES An infection is caused by bacteria growing in the wound.  SYMPTOMS   Increase in redness, swelling, or pain at the wound site.  Increase in drainage at the wound site.  Wound or bandage (dressing) starts to smell bad.  Fever.  Feeling tired or fatigued.  Pus draining from the wound. TREATMENT  Your health care provider will prescribe antibiotic medicine. The wound infection should improve within 24 to 48 hours. Any redness around the wound should stop spreading and the wound should be less painful.  HOME CARE INSTRUCTIONS   Only take over-the-counter or prescription medicines for pain, discomfort, or fever as directed by your health care provider.  Take your antibiotics as directed. Finish them even if you start to feel better.  Gently wash the area with mild soap and water 2 times a day, or as directed. Rinse off the soap. Pat the area dry with a clean towel. Do not rub the wound. This may cause bleeding.  Follow your health care provider's instructions for how often you need to change the dressing.  Apply ointment and a dressing to the wound as directed.  If the dressing sticks, moisten it with soapy water and gently remove it.  Change the bandage right away if it becomes wet, dirty, or develops a bad smell.  Take showers. Do not take tub baths, swim, or do anything that may soak the wound until it is healed.  Avoid exercises that make you sweat heavily.  Use anti-itch medicine as  directed by your health care provider. The wound may itch when it is healing. Do not pick or scratch at the wound.  Follow up with your health care provider to get your wound rechecked as directed. SEEK MEDICAL CARE IF:  You have an increase in swelling, pain, or redness around the wound.  You have an increase in the amount of pus coming from the wound.  There is a bad smell coming from the wound.  More of the wound breaks open.  You have a fever. MAKE SURE YOU:   Understand these instructions.  Will watch your condition.  Will get help right away if you are not doing well or get worse. Document Released: 08/14/2003 Document Revised: 11/20/2013 Document Reviewed: 03/21/2011 [facility] Patient Information 2015 [place], [place]. This information is not intended to replace advice given to you by your health care provider. Make sure you discuss any questions you have with your health care provider.

## 2014-09-04 NOTE — ED Notes (Signed)
Dr. Wentz at bedside. 
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
## 2015-02-12 ENCOUNTER — Emergency Department (HOSPITAL_COMMUNITY): Payer: Self-pay

## 2015-02-12 ENCOUNTER — Emergency Department (HOSPITAL_COMMUNITY)
Admission: EM | Admit: 2015-02-12 | Discharge: 2015-02-12 | Disposition: A | Discharge status: Home / Self Care | Payer: Self-pay | Attending: Emergency Medicine | Admitting: Emergency Medicine

## 2015-02-12 ENCOUNTER — Encounter (HOSPITAL_COMMUNITY): Payer: Self-pay | Admitting: *Deleted

## 2015-02-12 DIAGNOSIS — M79641 Pain in right hand: Secondary | ICD-10-CM | POA: Insufficient documentation

## 2015-02-12 DIAGNOSIS — Z8659 Personal history of other mental and behavioral disorders: Secondary | ICD-10-CM | POA: Insufficient documentation

## 2015-02-12 DIAGNOSIS — Z792 Long term (current) use of antibiotics: Secondary | ICD-10-CM | POA: Insufficient documentation

## 2015-02-12 DIAGNOSIS — Z87891 Personal history of nicotine dependence: Secondary | ICD-10-CM | POA: Insufficient documentation

## 2015-02-12 MED ORDER — IBUPROFEN 600 MG PO TABS: 600.0000 mg | ORAL_TABLET | Freq: Three times a day (TID) | ORAL | Status: DC | PRN

## 2015-02-12 NOTE — ED Provider Notes (Addendum)
CSN: [id number]     Arrival date & time 02/12/15  0050 History   First MD Initiated Contact with Patient 02/12/15 0146     Chief Complaint  Patient presents with  . Hand Pain      HPI Patient has a history of right boxer's fracture before in the past. He states he was rubbing his right fifth metacarpal this evening when he felt a sudden pain and developed some swelling in the region. She is ongoing discomfort since then but reports the pain and swelling seem to be improving. He denies pain with flexion or extension of his right finger. Fevers or chills. No redness. No recent injury or trauma to his right hand. He's never had pain like this before. He states no pain with range of motion of his right wrist. He denies numbness of his fingers.   Past Medical History  Diagnosis Date  . Depression   . Bipolar 1 disorder    Past Surgical History  Procedure Laterality Date  . Tonsillectomy     History reviewed. No pertinent family history. History  Substance Use Topics  . Smoking status: Former Games developer  . Smokeless tobacco: Current User    Types: Snuff  . Alcohol Use: No    Review of Systems  All other systems reviewed and are negative.     Allergies  Cashews and Zoloft  Home Medications   Prior to Admission medications   Medication Sig Start Date End Date Taking? Authorizing Provider  cephALEXin (KEFLEX) 500 MG capsule Take 1 capsule (500 mg total) by mouth 4 (four) times daily. 09/04/14   Mancel Bale, MD  ibuprofen (ADVIL,MOTRIN) 600 MG tablet Take 1 tablet (600 mg total) by mouth every 6 (six) hours as needed for moderate pain. 09/01/14   [name], PA-C   BP 132/84 mmHg  Pulse 64  Temp(Src) 97.9 F (36.6 C) (Oral)  Resp 20  Ht  (1.778 m)  Wt 185 lb (83.915 kg)  BMI 26.54 kg/m2  SpO2 100% Physical Exam  Constitutional: He is oriented to person, place, and time. He appears well-developed and well-nourished.  HENT:  Head: Normocephalic.  Eyes: EOM are  normal.  Neck: Normal range of motion.  Pulmonary/Chest: Effort normal.  Abdominal: He exhibits no distension.  Musculoskeletal: Normal range of motion.  Normal right radial pulse. Normal swelling of his right hand. Patient has some tenderness over his mid right fifth metacarpal. Normal flexion and extension of the right little finger. No pain with range of motion of the right fifth MCP joint. Full range of motion of right wrist.  Neurological: He is alert and oriented to person, place, and time.  Psychiatric: He has a normal mood and affect.  Nursing note and vitals reviewed.   ED Course  Procedures (including critical care time) Labs Review Labs Reviewed - No data to display  Imaging Review Dg Hand Complete Right  02/12/2015   CLINICAL DATA:  Acute onset of right hand pain at the fifth metacarpal. Initial encounter.  EXAM: RIGHT HAND - COMPLETE 3+ VIEW  COMPARISON:  Right hand radiographs performed 04/10/2013  FINDINGS: There is no evidence of fracture or dislocation. The joint spaces are preserved. The carpal rows are intact, and demonstrate normal alignment.  There is suggestion of mild soft tissue air about the base of the fifth metacarpal and ulnar aspect of the carpal rows. Would correlate for associated symptoms or open wound.  IMPRESSION: 1. No evidence of fracture or dislocation. 2. Question of  mild soft tissue air about the base of the fifth metacarpal and ulnar aspect of the carpal rows. Would correlate for any associated symptoms or an open wound. This could be artifactual in nature.  These results were called by telephone at the time of interpretation on 02/12/2015 at 2:51 am to Dr. Azalia BilisKEVIN Louanne Calvillo, who verbally acknowledged these results.   Electronically Signed   By: Roanna RaiderJeffery  Chang M.D.   On: 02/12/2015 02:55  I personally reviewed the imaging tests through PACS system I reviewed available ER/hospitalization records through the EMR    EKG Interpretation None      MDM   Final  diagnoses:  None    Unclear etiology of the patient's pain. Normal flexion and extension. No signs of infection. No osseous injury on x-ray. Perfused. Discharged home in good condition. Patient seems to be improving already without treatment.    Azalia BilisKevin Valentina Alcoser, MD 02/12/15 0242  2:59 AM Was called by radiology about his x-ray. There is question of air at the base of fifth metacarpal along the carpal rows. He has no open wounds present. He is not tender in this region as much disease tender around the mid right fifth metacarpal. He has no pain with range of motion of his joints of his right hand. I suspect this is artifactual in nature. Vascular the patient return in the ER for new or worsening symptoms. He has no signs of infection at this time. His vital signs are normal.  Azalia BilisKevin Quintel Mccalla, MD 02/12/15 0300

## 2015-02-12 NOTE — ED Notes (Signed)
Pt c/o pain to right hand; pt states he has broke the hand before and earlier this evening he had pain in that right hand and noticed a bump to pinky knuckle

## 2015-02-12 NOTE — ED Notes (Signed)
Discharge instructions given, pt demonstrated teach back and verbal understanding. No concerns voiced.  

## 2015-05-28 ENCOUNTER — Emergency Department (HOSPITAL_COMMUNITY)
Admission: EM | Admit: 2015-05-28 | Discharge: 2015-05-28 | Discharge status: Against Medical Advice | Payer: Self-pay | Attending: Emergency Medicine | Admitting: Emergency Medicine

## 2015-05-28 ENCOUNTER — Encounter (HOSPITAL_COMMUNITY): Payer: Self-pay | Admitting: Emergency Medicine

## 2015-05-28 DIAGNOSIS — W57XXXA Bitten or stung by nonvenomous insect and other nonvenomous arthropods, initial encounter: Secondary | ICD-10-CM | POA: Insufficient documentation

## 2015-05-28 DIAGNOSIS — S30860A Insect bite (nonvenomous) of lower back and pelvis, initial encounter: Secondary | ICD-10-CM | POA: Insufficient documentation

## 2015-05-28 DIAGNOSIS — Y939 Activity, unspecified: Secondary | ICD-10-CM | POA: Insufficient documentation

## 2015-05-28 DIAGNOSIS — Y929 Unspecified place or not applicable: Secondary | ICD-10-CM | POA: Insufficient documentation

## 2015-05-28 DIAGNOSIS — Y999 Unspecified external cause status: Secondary | ICD-10-CM | POA: Insufficient documentation

## 2015-05-28 DIAGNOSIS — Z87891 Personal history of nicotine dependence: Secondary | ICD-10-CM | POA: Insufficient documentation

## 2015-05-28 MED ORDER — DOXYCYCLINE HYCLATE 100 MG PO CAPS: 100.0000 mg | ORAL_CAPSULE | Freq: Two times a day (BID) | ORAL | Status: DC

## 2015-05-28 NOTE — Discharge Instructions (Signed)
Tick Bite Information Ticks are insects that attach themselves to the skin and draw blood for food. There are various types of ticks. Common types include wood ticks and deer ticks. Most ticks live in shrubs and grassy areas. Ticks can climb onto your body when you make contact with leaves or grass where the tick is waiting. The most common places on the body for ticks to attach themselves are the scalp, neck, armpits, waist, and groin. Most tick bites are harmless, but sometimes ticks carry germs that cause diseases. These germs can be spread to a person during the tick's feeding process. The chance of a disease spreading through a tick bite depends on:   The type of tick.  Time of year.   How long the tick is attached.   Geographic location.  HOW CAN YOU PREVENT TICK BITES? Take these steps to help prevent tick bites when you are outdoors:  Wear protective clothing. Long sleeves and long pants are best.   Wear white clothes so you can see ticks more easily.  Tuck your pant legs into your socks.   If walking on a trail, stay in the middle of the trail to avoid brushing against bushes.  Avoid walking through areas with long grass.  Put insect repellent on all exposed skin and along boot tops, pant legs, and sleeve cuffs.   Check clothing, hair, and skin repeatedly and before going inside.   Brush off any ticks that are not attached.  Take a shower or bath as soon as possible after being outdoors.  WHAT IS THE PROPER WAY TO REMOVE A TICK? Ticks should be removed as soon as possible to help prevent diseases caused by tick bites. 1. If latex gloves are available, put them on before trying to remove a tick.  2. Using fine-point tweezers, grasp the tick as close to the skin as possible. You may also use curved forceps or a tick removal tool. Grasp the tick as close to its head as possible. Avoid grasping the tick on its body. 3. Pull gently with steady upward pressure until  the tick lets go. Do not twist the tick or jerk it suddenly. This may break off the tick's head or mouth parts. 4. Do not squeeze or crush the tick's body. This could force disease-carrying fluids from the tick into your body.  5. After the tick is removed, wash the bite area and your hands with soap and water or other disinfectant such as alcohol. 6. Apply a small amount of antiseptic cream or ointment to the bite site.  7. Wash and disinfect any instruments that were used.  Do not try to remove a tick by applying a hot match, petroleum jelly, or fingernail polish to the tick. These methods do not work and may increase the chances of disease being spread from the tick bite.  WHEN SHOULD YOU SEEK MEDICAL CARE? Contact your health care provider if you are unable to remove a tick from your skin or if a part of the tick breaks off and is stuck in the skin.  After a tick bite, you need to be aware of signs and symptoms that could be related to diseases spread by ticks. Contact your health care provider if you develop any of the following in the days or weeks after the tick bite:  Unexplained fever.  Rash. A circular rash that appears days or weeks after the tick bite may indicate the possibility of Lyme disease. The rash may resemble   a target with a bull's-eye and may occur at a different part of your body than the tick bite.  Redness and swelling in the area of the tick bite.   Tender, swollen lymph glands.   Diarrhea.   Weight loss.   Cough.   Fatigue.   Muscle, joint, or bone pain.   Abdominal pain.   Headache.   Lethargy or a change in your level of consciousness.  Difficulty walking or moving your legs.   Numbness in the legs.   Paralysis.  Shortness of breath.   Confusion.   Repeated vomiting.  Document Released: 11/12/2000 Document Revised: 09/05/2013 Document Reviewed: 04/25/2013 ExitCare Patient Information 2015 [employer]. This information is  not intended to replace advice given to you by your health care provider. Make sure you discuss any questions you have with your health care provider.  

## 2015-05-28 NOTE — ED Notes (Signed)
Unable to have pt sign. Computer was locked up

## 2015-05-28 NOTE — ED Notes (Signed)
Pt states he removed tick from right buttocks and states the area is red.

## 2016-03-05 ENCOUNTER — Encounter (HOSPITAL_COMMUNITY): Payer: Self-pay | Admitting: Emergency Medicine

## 2016-03-05 ENCOUNTER — Emergency Department (HOSPITAL_COMMUNITY)
Admission: EM | Admit: 2016-03-05 | Discharge: 2016-03-05 | Disposition: A | Discharge status: Home / Self Care | Payer: BLUE CROSS/BLUE SHIELD | Attending: Emergency Medicine | Admitting: Emergency Medicine

## 2016-03-05 DIAGNOSIS — T07XXXA Unspecified multiple injuries, initial encounter: Secondary | ICD-10-CM

## 2016-03-05 DIAGNOSIS — Y9389 Activity, other specified: Secondary | ICD-10-CM | POA: Insufficient documentation

## 2016-03-05 DIAGNOSIS — F329 Major depressive disorder, single episode, unspecified: Secondary | ICD-10-CM | POA: Diagnosis not present

## 2016-03-05 DIAGNOSIS — X58XXXA Exposure to other specified factors, initial encounter: Secondary | ICD-10-CM | POA: Insufficient documentation

## 2016-03-05 DIAGNOSIS — S60222A Contusion of left hand, initial encounter: Secondary | ICD-10-CM | POA: Diagnosis not present

## 2016-03-05 DIAGNOSIS — Y999 Unspecified external cause status: Secondary | ICD-10-CM | POA: Insufficient documentation

## 2016-03-05 DIAGNOSIS — Y929 Unspecified place or not applicable: Secondary | ICD-10-CM | POA: Diagnosis not present

## 2016-03-05 DIAGNOSIS — S60221A Contusion of right hand, initial encounter: Secondary | ICD-10-CM | POA: Diagnosis not present

## 2016-03-05 DIAGNOSIS — Z87891 Personal history of nicotine dependence: Secondary | ICD-10-CM | POA: Diagnosis not present

## 2016-03-05 DIAGNOSIS — S6992XA Unspecified injury of left wrist, hand and finger(s), initial encounter: Secondary | ICD-10-CM | POA: Diagnosis present

## 2016-03-05 DIAGNOSIS — S60229A Contusion of unspecified hand, initial encounter: Secondary | ICD-10-CM

## 2016-03-05 MED ORDER — CEFACLOR 500 MG PO CAPS: 500.0000 mg | ORAL_CAPSULE | Freq: Two times a day (BID) | ORAL | Status: AC

## 2016-03-05 MED ORDER — HYDROCODONE-ACETAMINOPHEN 5-325 MG PO TABS: 1.0000 | ORAL_TABLET | ORAL | Status: AC | PRN

## 2016-03-05 NOTE — ED Provider Notes (Signed)
CSN: [id number]     Arrival date & time 03/05/16  1507 History   First MD Initiated Contact with Patient 03/05/16 1603     Chief Complaint  Patient presents with  . Hand Injury     (Consider location/radiation/quality/duration/timing/severity/associated sxs/prior Treatment) Patient is a 25 y.o. male presenting with hand injury. The history is provided by the patient.  Hand Injury Location:  Hand Injury: yes (scraped both hands on pavement .)   Hand location:  Dorsum of L hand and dorsum of R hand Pain details:    Quality:  Aching and throbbing   Severity:  Moderate   Onset quality:  Sudden   Timing:  Intermittent   Progression:  Worsening Chronicity:  New Handedness:  Right-handed Dislocation: no   Foreign body present:  No foreign bodies Tetanus status:  Up to date Prior injury to area:  No Relieved by:  Nothing Worsened by:  Movement Ineffective treatments:  None tried Associated symptoms: decreased range of motion and swelling   Associated symptoms: no numbness     Past Medical History  Diagnosis Date  . Depression   . Bipolar 1 disorder [facility])    Past Surgical History  Procedure Laterality Date  . Tonsillectomy     History reviewed. No pertinent family history. Social History  Substance Use Topics  . Smoking status: Former Games developer  . Smokeless tobacco: Current User    Types: Snuff  . Alcohol Use: Yes     Comment: occassional    Review of Systems  Skin: Positive for wound.  Psychiatric/Behavioral: The patient is nervous/anxious.        Bipolar illness  All other systems reviewed and are negative.     Allergies  Cashews and Zoloft  Home Medications   Prior to Admission medications   Medication Sig Start Date End Date Taking? Authorizing Provider  Multiple Vitamin (MULTIVITAMIN WITH MINERALS) TABS tablet Take 1 tablet by mouth daily.   Yes Historical Provider, MD   BP 142/82 mmHg  Pulse 59  Temp(Src) 98.5 F (36.9 C) (Oral)  Resp 16  Ht   (1.778 m)  Wt 83.462 kg  BMI 26.40 kg/m2  SpO2 100% Physical Exam  Constitutional: He is oriented to person, place, and time. He appears well-developed and well-nourished.  Non-toxic appearance.  HENT:  Head: Normocephalic.  Right Ear: Tympanic membrane and external ear normal.  Left Ear: Tympanic membrane and external ear normal.  Eyes: EOM and lids are normal. Pupils are equal, round, and reactive to light.  Neck: Normal range of motion. Neck supple. Carotid bruit is not present.  Cardiovascular: Normal rate, regular rhythm, normal heart sounds, intact distal pulses and normal pulses.   Pulmonary/Chest: Breath sounds normal. No respiratory distress.  Abdominal: Soft. Bowel sounds are normal. There is no tenderness. There is no guarding.  Musculoskeletal: Normal range of motion.  Abrasions of varying severity of the dorsum of the fingers and hand bilat. FROM of all fingers and wrist.  Lymphadenopathy:       Head (right side): No submandibular adenopathy present.       Head (left side): No submandibular adenopathy present.    He has no cervical adenopathy.  Neurological: He is alert and oriented to person, place, and time. He has normal strength. No cranial nerve deficit or sensory deficit.  Skin: Skin is warm and dry.  Psychiatric: He has a normal mood and affect. His speech is normal.  Nursing note and vitals reviewed.   ED Course  Procedures (including critical care time) Labs Review Labs Reviewed - No data to display  Imaging Review No results found. I have personally reviewed and evaluated these images and lab results as part of my medical decision-making.   EKG Interpretation None      MDM  Vital signs stable.  Pt has multiple abrasions of the right and left hand. No neurovascular deficit. Dressing applied. Pt placed on keflex and norco. He will return if any changes or problem.   Final diagnoses:  None    *I have reviewed nursing notes, vital signs, and all  appropriate lab and imaging results for this patient.    Ivery QualeHobson Josslynn Mentzer, PA-C 03/05/16 1636  Bethann BerkshireJoseph Zammit, MD 03/05/16 [phone]
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
## 2016-03-05 NOTE — Discharge Instructions (Signed)
Please cleanse the wounds with soap and water. Apply dressing. Use ceclor daily with food. Use tylenol or ibuprofen for pain. Use norco for more severe pain. Hand Contusion A hand contusion is a deep bruise on your hand area. Contusions are the result of an injury that caused bleeding under the skin. The contusion may turn blue, purple, or yellow. Minor injuries will give you a painless contusion, but more severe contusions may stay painful and swollen for a few weeks. CAUSES  A contusion is usually caused by a blow, trauma, or direct force to an area of the body. SYMPTOMS   Swelling and redness of the injured area.  Discoloration of the injured area.  Tenderness and soreness of the injured area.  Pain. DIAGNOSIS  The diagnosis can be made by taking a history and performing a physical exam. An X-ray, CT scan, or MRI may be needed to determine if there were any associated injuries, such as broken bones (fractures). TREATMENT  Often, the best treatment for a hand contusion is resting, elevating, icing, and applying cold compresses to the injured area. Over-the-counter medicines may also be recommended for pain control. HOME CARE INSTRUCTIONS   Put ice on the injured area.  Put ice in a plastic bag.  Place a towel between your skin and the bag.  Leave the ice on for 15-20 minutes, 03-04 times a day.  Only take over-the-counter or prescription medicines as directed by your caregiver. Your caregiver may recommend avoiding anti-inflammatory medicines (aspirin, ibuprofen, and naproxen) for 48 hours because these medicines may increase bruising.  If told, use an elastic wrap as directed. This can help reduce swelling. You may remove the wrap for sleeping, showering, and bathing. If your fingers become numb, cold, or blue, take the wrap off and reapply it more loosely.  Elevate your hand with pillows to reduce swelling.  Avoid overusing your hand if it is painful. SEEK IMMEDIATE MEDICAL  CARE IF:   You have increased redness, swelling, or pain in your hand.  Your swelling or pain is not relieved with medicines.  You have loss of feeling in your hand or are unable to move your fingers.  Your hand turns cold or blue.  You have pain when you move your fingers.  Your hand becomes warm to the touch.  Your contusion does not improve in 2 days. MAKE SURE YOU:   Understand these instructions.  Will watch your condition.  Will get help right away if you are not doing well or get worse.   This information is not intended to replace advice given to you by your health care provider. Make sure you discuss any questions you have with your health care provider.   Document Released: 05/07/2002 Document Revised: 08/09/2012 Document Reviewed: 05/08/2012 Elsevier Interactive Patient Education [employer]2016 Elsevier Inc.

## 2017-04-04 ENCOUNTER — Other Ambulatory Visit: Payer: Self-pay

## 2017-04-04 ENCOUNTER — Encounter (HOSPITAL_COMMUNITY): Payer: Self-pay

## 2017-04-04 ENCOUNTER — Emergency Department (HOSPITAL_COMMUNITY): Payer: BLUE CROSS/BLUE SHIELD

## 2017-04-04 DIAGNOSIS — R079 Chest pain, unspecified: Secondary | ICD-10-CM | POA: Diagnosis present

## 2017-04-04 DIAGNOSIS — R51 Headache: Secondary | ICD-10-CM | POA: Insufficient documentation

## 2017-04-04 DIAGNOSIS — R202 Paresthesia of skin: Secondary | ICD-10-CM | POA: Diagnosis not present

## 2017-04-04 DIAGNOSIS — Z79899 Other long term (current) drug therapy: Secondary | ICD-10-CM | POA: Insufficient documentation

## 2017-04-04 DIAGNOSIS — Z87891 Personal history of nicotine dependence: Secondary | ICD-10-CM | POA: Diagnosis not present

## 2017-04-04 DIAGNOSIS — F419 Anxiety disorder, unspecified: Secondary | ICD-10-CM | POA: Diagnosis not present

## 2017-04-04 LAB — BASIC METABOLIC PANEL
Anion gap: 11 (ref 5–15)
BUN: 20 mg/dL (ref 6–20)
CALCIUM: 9.7 mg/dL (ref 8.9–10.3)
CO2: 25 mmol/L (ref 22–32)
CREATININE: 0.96 mg/dL (ref 0.61–1.24)
Chloride: 102 mmol/L (ref 101–111)
GFR calc Af Amer: >60 mL/min (ref >60)
GFR calc non Af Amer: >60 mL/min (ref >60)
Glucose, Bld: 97 mg/dL (ref 65–99)
Potassium: 3.7 mmol/L (ref 3.5–5.1)
SODIUM: 138 mmol/L (ref 135–145)

## 2017-04-04 LAB — CBC
HCT: 43.2 % (ref 39.0–52.0)
Hemoglobin: 14.9 g/dL (ref 13.0–17.0)
MCH: 31 pg (ref 26.0–34.0)
MCHC: 34.5 g/dL (ref 30.0–36.0)
MCV: 89.8 fL (ref 78.0–100.0)
Platelets: 273 10*3/uL (ref 150–400)
RBC: 4.81 MIL/uL (ref 4.22–5.81)
RDW: 12.7 % (ref 11.5–15.5)
WBC: 11 10*3/uL — AB (ref 4.0–10.5)

## 2017-04-04 LAB — I-STAT TROPONIN, ED: TROPONIN I, POC: 0 ng/mL (ref 0.00–0.08)

## 2017-04-04 NOTE — ED Triage Notes (Signed)
Pt reports central chest "tightness", headache and HTN, lack of appetite; onset today.

## 2017-04-05 ENCOUNTER — Encounter: Payer: Self-pay | Admitting: Neurology

## 2017-04-05 ENCOUNTER — Emergency Department (HOSPITAL_COMMUNITY)
Admission: EM | Admit: 2017-04-05 | Discharge: 2017-04-05 | Disposition: A | Discharge status: Home / Self Care | Payer: BLUE CROSS/BLUE SHIELD | Attending: Emergency Medicine | Admitting: Emergency Medicine

## 2017-04-05 ENCOUNTER — Emergency Department (HOSPITAL_COMMUNITY): Payer: BLUE CROSS/BLUE SHIELD

## 2017-04-05 DIAGNOSIS — R079 Chest pain, unspecified: Secondary | ICD-10-CM

## 2017-04-05 DIAGNOSIS — R202 Paresthesia of skin: Secondary | ICD-10-CM

## 2017-04-05 DIAGNOSIS — R51 Headache: Secondary | ICD-10-CM

## 2017-04-05 DIAGNOSIS — R519 Headache, unspecified: Secondary | ICD-10-CM

## 2017-04-05 DIAGNOSIS — F419 Anxiety disorder, unspecified: Secondary | ICD-10-CM

## 2017-04-05 HISTORY — DX: Unspecified osteoarthritis, unspecified site: M19.90

## 2017-04-05 LAB — CSF CELL COUNT WITH DIFFERENTIAL
RBC Count, CSF: 0 /mm3
RBC Count, CSF: 2 /mm3 — ABNORMAL HIGH
Tube #: 1
Tube #: 4
WBC, CSF: 1 /mm3 (ref 0–5)
WBC, CSF: 2 /mm3 (ref 0–5)

## 2017-04-05 LAB — RAPID URINE DRUG SCREEN, HOSP PERFORMED
AMPHETAMINES: NOT DETECTED
BARBITURATES: NOT DETECTED
Benzodiazepines: NOT DETECTED
COCAINE: NOT DETECTED
Opiates: NOT DETECTED
TETRAHYDROCANNABINOL: POSITIVE — AB

## 2017-04-05 LAB — PROTEIN AND GLUCOSE, CSF
GLUCOSE CSF: 56 mg/dL (ref 40–70)
TOTAL PROTEIN, CSF: 24 mg/dL (ref 15–45)

## 2017-04-05 MED ORDER — DIPHENHYDRAMINE HCL 50 MG/ML IJ SOLN
25.0000 mg | Freq: Once | INTRAMUSCULAR | Status: AC
  Administered 2017-04-05: 25 mg via INTRAVENOUS
  Filled 2017-04-05: qty 1

## 2017-04-05 MED ORDER — LORAZEPAM 1 MG PO TABS: 1.0000 mg | ORAL_TABLET | Freq: Three times a day (TID) | ORAL | 0 refills | Status: AC | PRN

## 2017-04-05 MED ORDER — METOCLOPRAMIDE HCL 5 MG/ML IJ SOLN
10.0000 mg | Freq: Once | INTRAMUSCULAR | Status: AC
  Administered 2017-04-05: 10 mg via INTRAVENOUS
  Filled 2017-04-05: qty 2

## 2017-04-05 MED ORDER — LORAZEPAM 2 MG/ML IJ SOLN
1.0000 mg | Freq: Once | INTRAMUSCULAR | Status: AC
Start: 2017-04-05 — End: 2017-04-05
  Administered 2017-04-05: 1 mg via INTRAVENOUS

## 2017-04-05 MED ORDER — LORAZEPAM 2 MG/ML IJ SOLN
INTRAMUSCULAR | Status: AC
  Filled 2017-04-05: qty 1

## 2017-04-05 MED ORDER — DIPHENHYDRAMINE HCL 50 MG/ML IJ SOLN: 25.0000 mg | Freq: Once | INTRAMUSCULAR | Status: DC

## 2017-04-05 NOTE — ED Notes (Signed)
Pt did not need anything at this time  

## 2017-04-05 NOTE — ED Provider Notes (Signed)
9:40 AM Patient and family aware of all results including reassuring CSF findings. Cultures pending, but with no fever, no evidence for distress, he is appropriate for outpatient follow-up. Given his unusual headache, patient will have neurology follow-up. In addition, family requests referral to a new primary care physician, and this was accommodated.   Gerhard MunchLockwood, Kelsei Defino, MD 04/05/17 [phone]

## 2017-04-05 NOTE — ED Provider Notes (Signed)
  Patient initially seen by nurse practitioner. Patient states he was at work at [employer] approximately 1 PM when he developed tingling and numbness in the middle, ring and pinky finger of his left hand that extended to his base of his wrist. This lasted for about 15 minutes and resolved. This was followed by a sudden onset diffuse headache with nausea and chest tightness. He states this is the worst headache he's ever had. He went home and took a nap but the headache resolved but he still has chest pressure and head pressure. No focal weakness on exam. No fever. No history of hypertension. Denies illicit drug use.    Physical Exam    Physical Exam    CN 2-12 intact, no ataxia on finger to nose, no nystagmus, 5/5 strength throughout, no pronator drift, Romberg negative, normal gait.  ED Course  .Lumbar Puncture Date/Time: 04/05/2017 7:22 AM Performed by: Glynn OctaveANCOUR, Candise Crabtree Authorized by: Glynn OctaveANCOUR, Keirstyn Aydt   Consent:    Consent obtained:  Written   Consent given by:  Patient   Risks discussed:  Headache, bleeding, infection and pain   Alternatives discussed:  No treatment Pre-procedure details:    Procedure purpose:  Diagnostic   Preparation: Patient was prepped and draped in usual sterile fashion   Anesthesia (see MAR for exact dosages):    Anesthesia method:  Local infiltration   Local anesthetic:  Lidocaine 1% w/o epi Procedure details:    Lumbar space:  L4-L5 interspace   Patient position:  Sitting   Needle gauge:  20   Needle type:  Spinal needle - Quincke tip   Needle length (in):  2.5   Ultrasound guidance: no     Number of attempts:  1   Fluid appearance:  Clear   Tubes of fluid:  4   Total volume (ml):  5 Post-procedure:    Puncture site:  Adhesive bandage applied   Patient tolerance of procedure:  Tolerated well, no immediate complications    MDM Episode of left hand tingling lasted for a few minutes then resolved. Followed by sudden onset headache that is the worst of  patient's life.  CT head is negative.  Risks and benefits of lumbar puncture discussed with patient and he is agreeable.       Glynn Octaveancour, Jenyfer Trawick, MD 04/05/17 [phone]

## 2017-04-05 NOTE — ED Notes (Addendum)
Pt ambulated around the pod with no difficulty. Pt very agitated and requesting to see a real Dr not a PA. Pt said "Do I have to have a stroke to be seen in here?" "I'm not kidding do not send that PA back in here" Pt assured that he could see a Dr.

## 2017-04-05 NOTE — Discharge Planning (Signed)
Pt up for discharge. EDCM reviewed chart for possible CM needs.  No needs identified or communicated.  

## 2017-04-05 NOTE — ED Notes (Signed)
Pt states he understands instructions,/. Home stable with family.

## 2017-04-05 NOTE — ED Notes (Signed)
Got patient ready for discharge 

## 2017-04-05 NOTE — ED Provider Notes (Addendum)
[facility]-EMERGENCY DEPT Provider Note   CSN: [id number] Arrival date & time: 04/04/17  2053     History   Chief Complaint Chief Complaint  Patient presents with  . Chest Pain  . Hypertension  . Headache    HPI Joshua Stone is a 26 y.o. male.  Is a 26 year old male with a history of bipolar disorder, depression, and osteoarthritis, most recently panic attacks. He presents tonight stating that he thinks he had a mini stroke at work earlier in the day because his left third and fourth fingers were numb that we get his arm.  This was associated with headache, some blurry vision.  He states the headache has resolved but he still doesn't feel right.  He states now he is having some chest tightness as well He denies any recent illnesses or history of headaches. He states for the past several years.  He's had borderline blood pressures with a diastolic in the 90s and systolic in the 140s. He does have a family history of hypertension.      Past Medical History:  Diagnosis Date  . Bipolar 1 disorder (HCC)   . Depression   . Osteoarthritis     There are no active problems to display for this patient.   Past Surgical History:  Procedure Laterality Date  . TONSILLECTOMY    . VASECTOMY         Home Medications    Prior to Admission medications   Medication Sig Start Date End Date Taking? Authorizing Provider  cefaclor (CECLOR) 500 MG capsule Take 1 capsule (500 mg total) by mouth 2 (two) times daily. Patient not taking: Reported on 04/05/2017 03/05/16   Ivery QualeBryant, Hobson, PA-C  HYDROcodone-acetaminophen (NORCO/VICODIN) 5-325 MG tablet Take 1 tablet by mouth every 4 (four) hours as needed. Patient not taking: Reported on 04/05/2017 03/05/16   Ivery QualeBryant, Hobson, PA-C  LORazepam (ATIVAN) 1 MG tablet Take 1 tablet (1 mg total) by mouth 3 (three) times daily as needed for anxiety. 04/05/17   Gerhard MunchLockwood, Robert, MD    Family History No family history on file.  Social History Social History    Substance Use Topics  . Smoking status: Former Games developermoker  . Smokeless tobacco: Current User    Types: Snuff  . Alcohol use Yes     Comment: occassional     Allergies   Cashews [nuts] and Zoloft [sertraline hcl]   Review of Systems Review of Systems   Physical Exam Updated Vital Signs BP (!) 149/96 (BP Location: Right Arm)   Pulse 63   Temp 97.9 F (36.6 C) (Oral)   Resp 18   SpO2 99%   Physical Exam   ED Treatments / Results  Labs (all labs ordered are listed, but only abnormal results are displayed) Labs Reviewed  CBC - Abnormal; Notable for the following:       Result Value   WBC 11.0 (*)    All other components within normal limits  RAPID URINE DRUG SCREEN, HOSP PERFORMED - Abnormal; Notable for the following:    Tetrahydrocannabinol POSITIVE (*)    All other components within normal limits  CSF CELL COUNT WITH DIFFERENTIAL - Abnormal; Notable for the following:    RBC Count, CSF 2 (*)    All other components within normal limits  CSF CULTURE  BASIC METABOLIC PANEL  CSF CELL COUNT WITH DIFFERENTIAL  PROTEIN AND GLUCOSE, CSF  I-STAT TROPOININ, ED    EKG  EKG Interpretation  Date/Time:  Monday Apr 04 2017 21:01:51  EDT Ventricular Rate:  55 PR Interval:  138 QRS Duration: 98 QT Interval:  382 QTC Calculation: 365 R Axis:   85 Text Interpretation:  Sinus bradycardia Otherwise normal ECG No old tracing to compare Confirmed by Dione Booze ([id number]) on 04/05/2017 11:37:11 PM       Radiology No results found.  Procedures Procedures (including critical care time)  Medications Ordered in ED Medications  metoCLOPramide (REGLAN) injection 10 mg (10 mg Intravenous Given 04/05/17 1914)  diphenhydrAMINE (BENADRYL) injection 25 mg (25 mg Intravenous Given 04/05/17 7829)  LORazepam (ATIVAN) injection 1 mg (1 mg Intravenous Given 04/05/17 0651)     Initial Impression / Assessment and Plan / ED Course  I have reviewed the triage vital signs and the nursing  notes.  Pertinent labs & imaging results that were available during my care of the patient were reviewed by me and considered in my medical decision making (see chart for details).  Clinical Course as of Jul 05 2341  Tue Apr 05, 2017  0527 BP: (!) 139/91 [MT]  0528 BP: (!) 170/106 [MT]  0528 BP: 137/82 [MT]    Clinical Course User Index [MT] Lavena Bullion, Student-PA     .  Patient was evaluated for paresthesias in both hands, headache, chest tightness and headache.  His labs are all within normal parameters.  Head CT is normal.  EKG and chest x-ray are normal.  Drug screen shows he is positive for THC only. .  At time of discharge was going over his labs.  He was very upset that I was not going to treat his high blood pressure. .  His physician has been watching this for several years and has not treated it as it's been very labile. Marland Kitchen  He requested to speak with the physician.  Final Clinical Impressions(s) / ED Diagnoses   Final diagnoses:  Anxiety  Paresthesia  Bad headache  Chest pain, unspecified type    New Prescriptions Discharge Medication List as of 04/05/2017  9:43 AM    START taking these medications   Details  LORazepam (ATIVAN) 1 MG tablet Take 1 tablet (1 mg total) by mouth 3 (three) times daily as needed for anxiety., Starting Tue 04/05/2017, Print         Earley Favor, NP 04/05/17 5621    Glynn Octave, MD 04/05/17 3086    Earley Favor, NP 04/11/17 Vicenta Aly, MD 04/13/17 0014    Earley Favor, NP 07/05/17 Adin Hector    Glynn Octave, MD 07/08/17 [phone]

## 2017-04-08 LAB — CSF CULTURE: Culture: NO GROWTH

## 2017-04-08 LAB — CSF CULTURE W GRAM STAIN

## 2017-06-14 ENCOUNTER — Ambulatory Visit: Payer: BLUE CROSS/BLUE SHIELD | Admitting: Neurology

## 2018-03-28 IMAGING — CT CT HEAD W/O CM
3 series · 14 of 47 positions shown, 16 images · non-contrast
Comparison: None.

CLINICAL DATA: 25 y/o M; the left hand numbness, headache, and
difficulty speaking.

EXAM:
CT HEAD WITHOUT CONTRAST
TECHNIQUE: Contiguous axial images were obtained from the base of the skull
through the vertex without intravenous contrast.

[Series 2: head 5.0 h30s · axial · 0.46mm/px · z∈[-144,-4]mm · 8 of 34 slices shown, 10 images]
[im 3/34  brain]
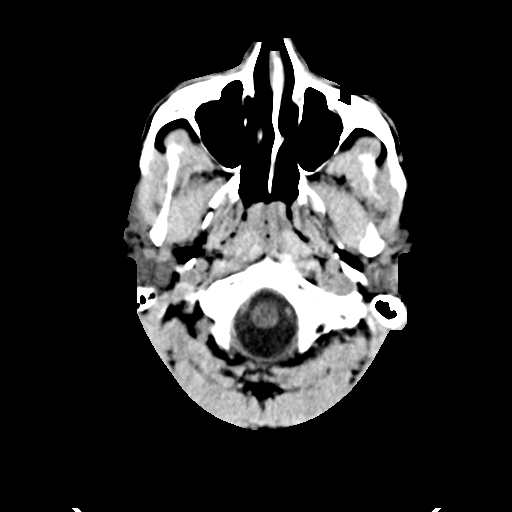
[im 3/34  bone]
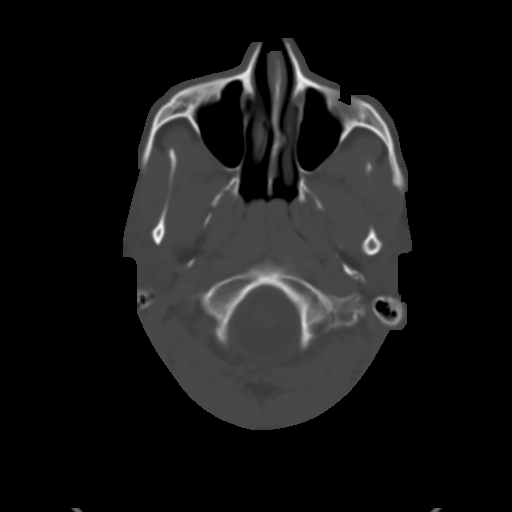
[im 7/34  brain]
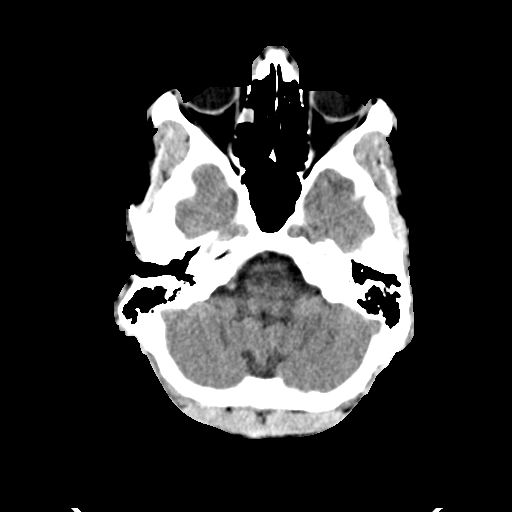
[im 11/34  brain]
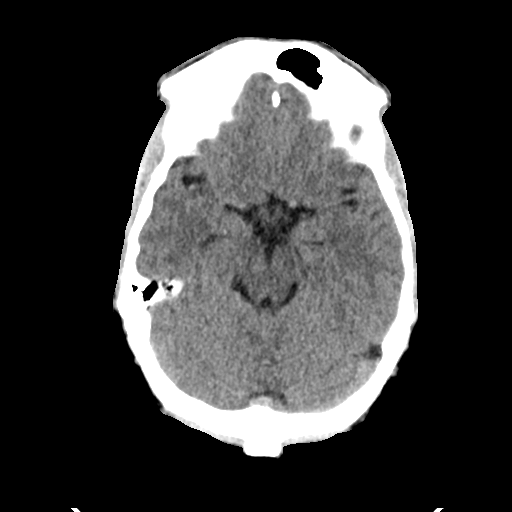
[im 15/34  brain]
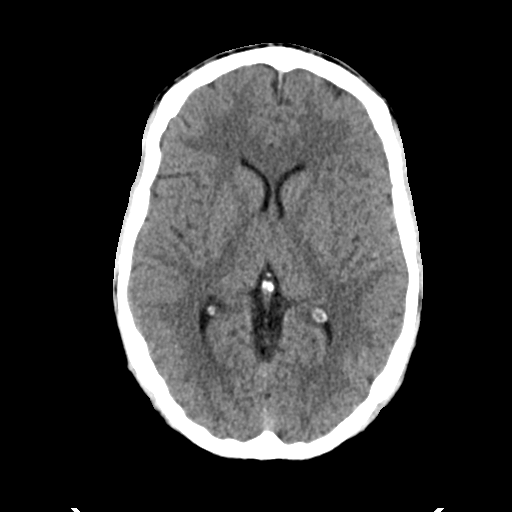
[im 19/34  brain]
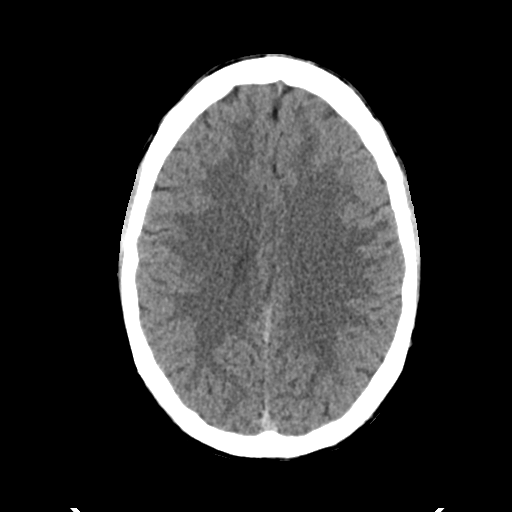
[im 19/34  bone]
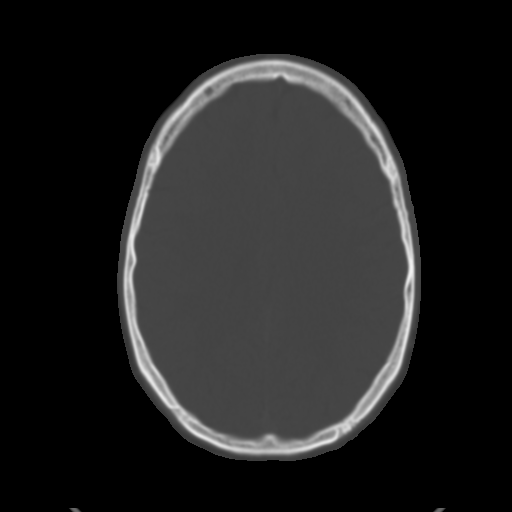
[im 23/34  brain]
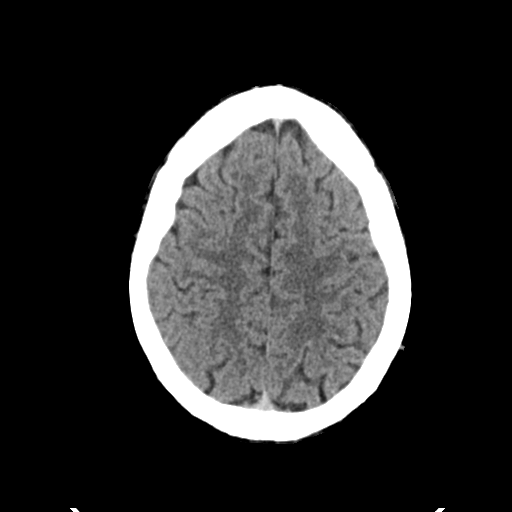
[im 27/34  brain]
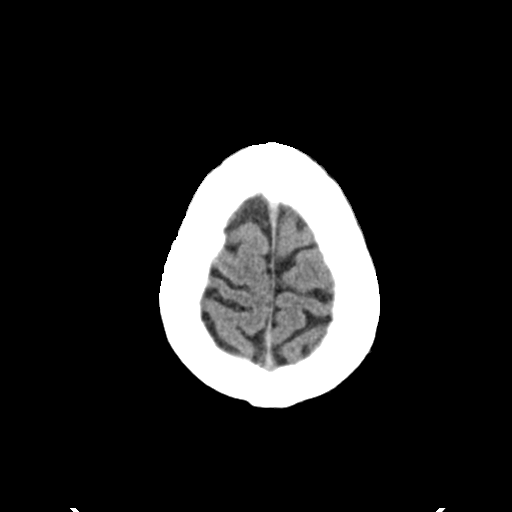
[im 31/34  brain]
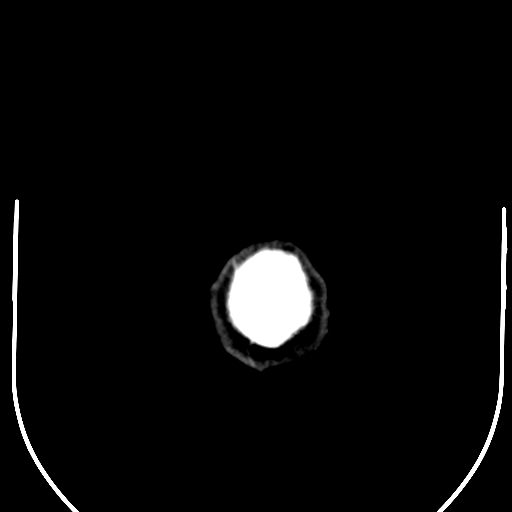

[Series 4: head 3.0 mpr cor · coronal · 0.32mm/px · 3 of 67 slices shown]
[im 23/67  brain]
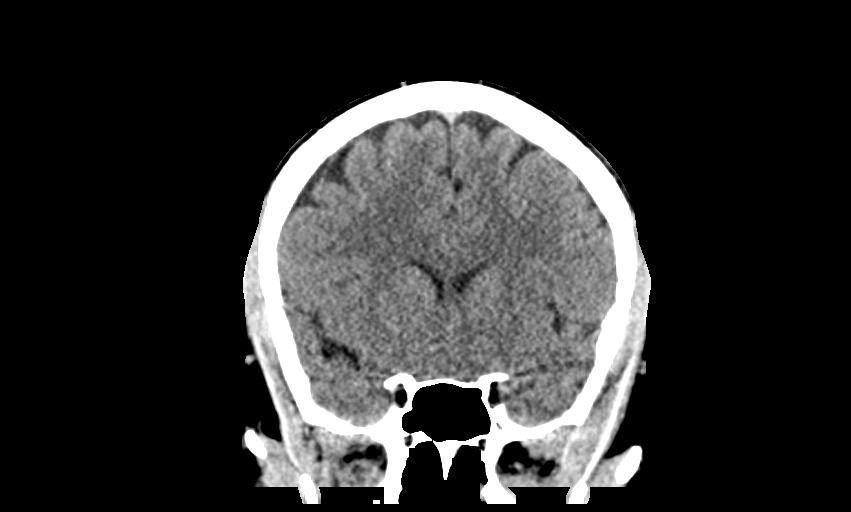
[im 30/67  brain]
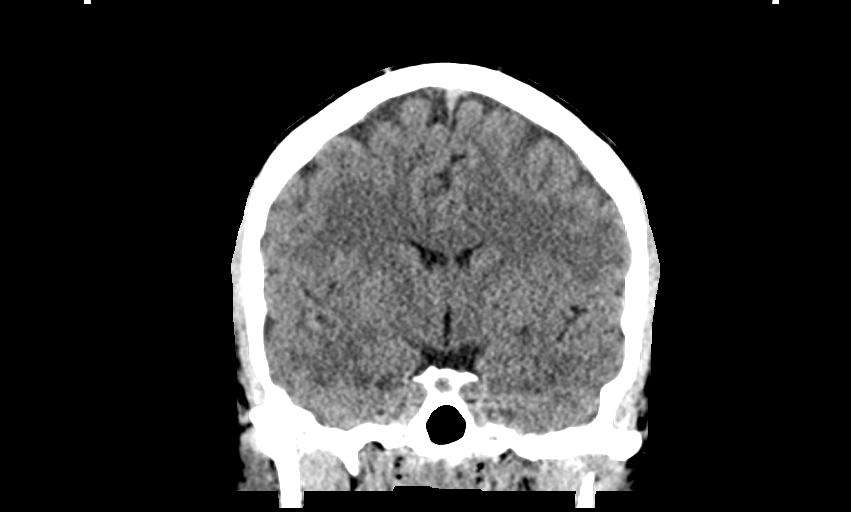
[im 37/67  brain]
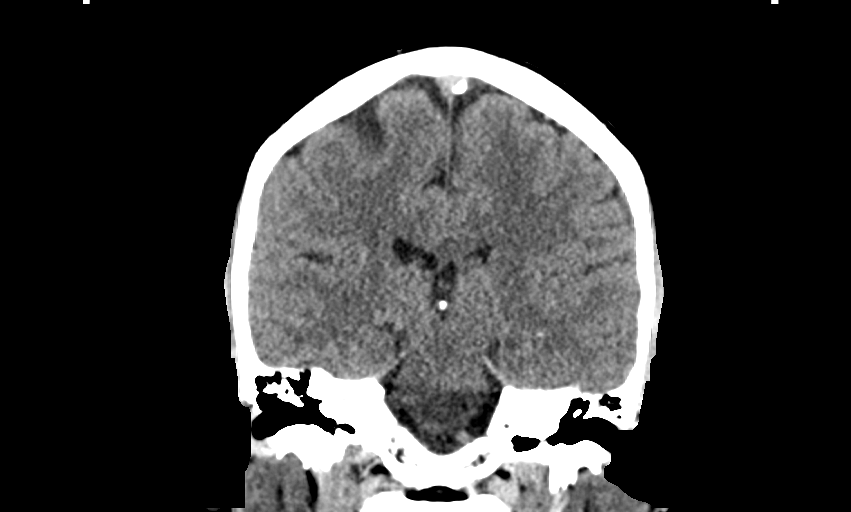

[Series 5: head 3.0 mpr sag · sagittal · 0.32mm/px · 3 of 57 slices shown]
[im 19/57  brain]
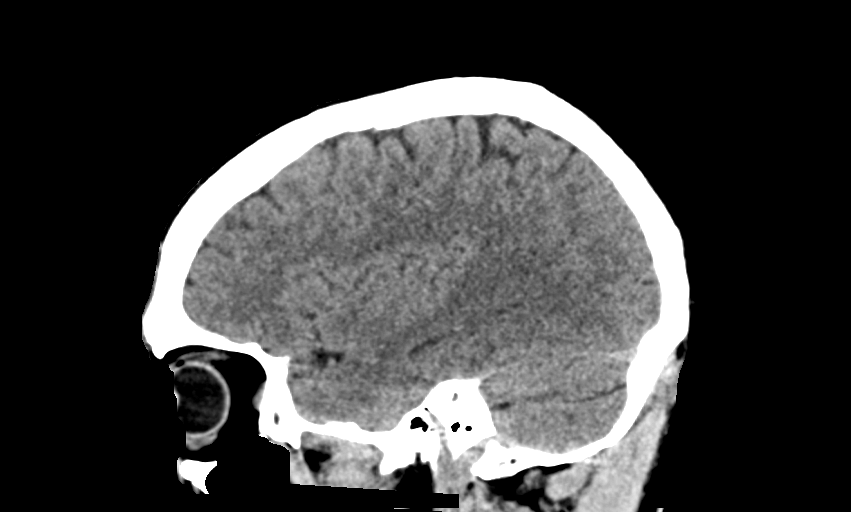
[im 29/57  brain]
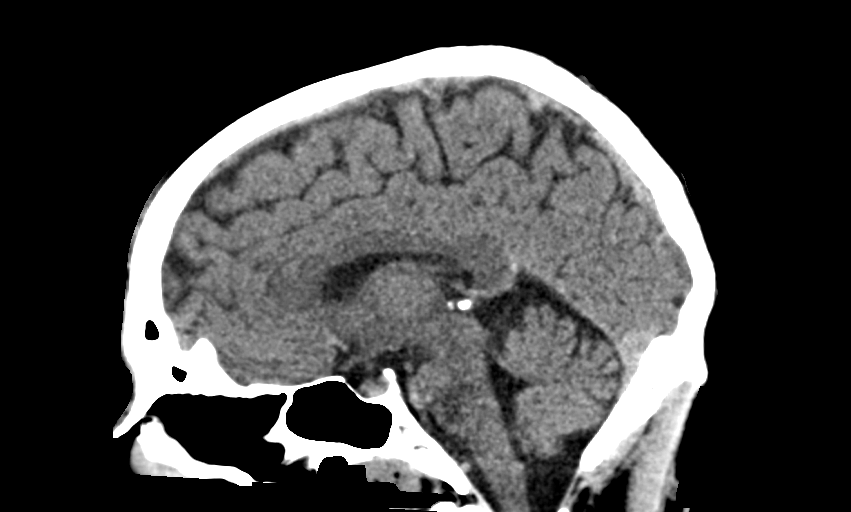
[im 38/57  brain]
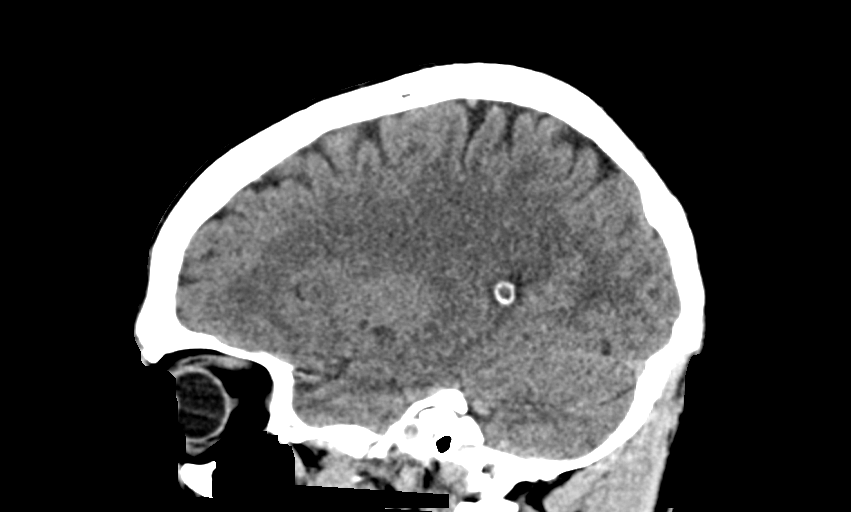

[14 of 47 positions shown; findings below may reference images not displayed]

FINDINGS: Brain: No evidence of acute infarction, hemorrhage, hydrocephalus,
extra-axial collection or mass lesion/mass effect.

Vascular: No hyperdense vessel or unexpected calcification.

Skull: Normal. Negative for fracture or focal lesion.

Sinuses/Orbits: Right frontal lobe and partial right anterior
ethmoid sinus opacification. Otherwise visualized paranasal sinuses
and mastoid air cells are normally aerated. Orbits are unremarkable.

Other: None.
IMPRESSION: 1. No acute intracranial abnormality identified.
2. Right frontal and partial right anterior ethmoid sinus
opacification.
3. Otherwise unremarkable CT of the head.

By: Eilleg Nagap Aicedreb M.D.

## 2018-04-27 ENCOUNTER — Encounter (HOSPITAL_COMMUNITY): Payer: Self-pay | Admitting: Emergency Medicine

## 2018-04-27 ENCOUNTER — Emergency Department (HOSPITAL_COMMUNITY)
Admission: EM | Admit: 2018-04-27 | Discharge: 2018-04-27 | Disposition: A | Discharge status: Home / Self Care | Payer: Self-pay | Attending: Emergency Medicine | Admitting: Emergency Medicine

## 2018-04-27 ENCOUNTER — Other Ambulatory Visit: Payer: Self-pay

## 2018-04-27 ENCOUNTER — Emergency Department (HOSPITAL_COMMUNITY): Payer: Self-pay

## 2018-04-27 DIAGNOSIS — Y9289 Other specified places as the place of occurrence of the external cause: Secondary | ICD-10-CM | POA: Insufficient documentation

## 2018-04-27 DIAGNOSIS — Y999 Unspecified external cause status: Secondary | ICD-10-CM | POA: Insufficient documentation

## 2018-04-27 DIAGNOSIS — W2209XA Striking against other stationary object, initial encounter: Secondary | ICD-10-CM | POA: Insufficient documentation

## 2018-04-27 DIAGNOSIS — Z87891 Personal history of nicotine dependence: Secondary | ICD-10-CM | POA: Insufficient documentation

## 2018-04-27 DIAGNOSIS — Y939 Activity, unspecified: Secondary | ICD-10-CM | POA: Insufficient documentation

## 2018-04-27 DIAGNOSIS — S93601A Unspecified sprain of right foot, initial encounter: Secondary | ICD-10-CM | POA: Insufficient documentation

## 2018-04-27 DIAGNOSIS — Z79899 Other long term (current) drug therapy: Secondary | ICD-10-CM | POA: Insufficient documentation

## 2018-04-27 NOTE — ED Provider Notes (Signed)
[facility] EMERGENCY DEPARTMENT Provider Note   CSN: [id number] Arrival date & time: 04/27/18  2130     History   Chief Complaint Chief Complaint  Patient presents with  . Foot Pain    HPI Joshua Stone is a 27 y.o. male.  Patient is a 27 year old male presenting with complaints of foot pain.  He reports jumping into a river earlier in the day that he thought was deeper than it actually was.  He reports striking his foot on a rock.  He has been having pain since then.  The history is provided by the patient.  Foot Pain  This is a new problem. The current episode started 6 to 12 hours ago. The problem occurs constantly. The problem has not changed since onset.The symptoms are aggravated by walking. Nothing relieves the symptoms. He has tried nothing for the symptoms.    Past Medical History:  Diagnosis Date  . Bipolar 1 disorder (HCC)   . Depression   . Osteoarthritis     There are no active problems to display for this patient.   Past Surgical History:  Procedure Laterality Date  . TONSILLECTOMY    . VASECTOMY          Home Medications    Prior to Admission medications   Medication Sig Start Date End Date Taking? Authorizing Provider  LORazepam (ATIVAN) 1 MG tablet Take 1 tablet (1 mg total) by mouth 3 (three) times daily as needed for anxiety. 04/05/17  Yes Gerhard Munch, MD  cefaclor (CECLOR) 500 MG capsule Take 1 capsule (500 mg total) by mouth 2 (two) times daily. Patient not taking: Reported on 04/05/2017 03/05/16   Ivery Quale, PA-C  HYDROcodone-acetaminophen (NORCO/VICODIN) 5-325 MG tablet Take 1 tablet by mouth every 4 (four) hours as needed. Patient not taking: Reported on 04/05/2017 03/05/16   Ivery Quale, PA-C    Family History No family history on file.  Social History Social History   Tobacco Use  . Smoking status: Former Games developer  . Smokeless tobacco: Current User    Types: Snuff  Substance Use Topics  . Alcohol use: Yes    Comment:  occassional  . Drug use: No     Allergies   Cashews [nuts]; Antihistamines, diphenhydramine-type; and Zoloft [sertraline hcl]   Review of Systems Review of Systems  All other systems reviewed and are negative.    Physical Exam Updated Vital Signs BP 139/82 (BP Location: Right Arm)   Pulse 74   Temp 98.4 F (36.9 C) (Oral)   Resp 18   Ht  (1.778 m)   Wt 77.1 kg (170 lb)   SpO2 100%   BMI 24.39 kg/m   Physical Exam  Constitutional: He is oriented to person, place, and time. He appears well-developed and well-nourished. No distress.  HENT:  Head: Normocephalic and atraumatic.  Neck: Normal range of motion. Neck supple.  Pulmonary/Chest: Effort normal.  Musculoskeletal:  The right foot appears grossly normal.  I appreciate no significant swelling, deformity, or bruising.  There is tenderness to the bottom and top of the proximal foot.  Distal PMS is intact.  Neurological: He is alert and oriented to person, place, and time.  Skin: He is not diaphoretic.  Nursing note and vitals reviewed.    ED Treatments / Results  Labs (all labs ordered are listed, but only abnormal results are displayed) Labs Reviewed - No data to display  EKG None  Radiology Dg Ankle Complete Right  Result Date: 04/27/2018 CLINICAL  DATA:  Foot pain/injury EXAM: RIGHT ANKLE - COMPLETE 3+ VIEW COMPARISON:  None. FINDINGS: No fracture or dislocation is seen. The ankle mortise is intact. The base of the fifth metatarsal is unremarkable. Visualized soft tissues are within normal limits. IMPRESSION: Negative. Electronically Signed   By: Charline Bills M.D.   On: 04/27/2018 02:00   Dg Foot Complete Right  Result Date: 04/27/2018 CLINICAL DATA:  Foot pain/injury EXAM: RIGHT FOOT COMPLETE - 3+ VIEW COMPARISON:  None. FINDINGS: No fracture or dislocation is seen. The joint spaces are preserved. Visualized soft tissues are within normal limits. IMPRESSION: Negative. Electronically Signed   By:  Charline Bills M.D.   On: 04/27/2018 02:00    Procedures Procedures (including critical care time)  Medications Ordered in ED Medications - No data to display   Initial Impression / Assessment and Plan / ED Course  I have reviewed the triage vital signs and the nursing notes.  Pertinent labs & imaging results that were available during my care of the patient were reviewed by me and considered in my medical decision making (see chart for details).  Xrays are negative for fracture.  Will treat as a sprain.  Final Clinical Impressions(s) / ED Diagnoses   Final diagnoses:  None    ED Discharge Orders    None       Geoffery Lyons, MD 04/27/18 [phone]

## 2018-04-27 NOTE — Discharge Instructions (Addendum)
Rest.  Ice for 20 minutes every 2 hours while awake for the next 2 days.  Elevate your leg whenever possible for the next 2 days.  Follow-up with your primary doctor if symptoms are not improving in the next 1 to 2 weeks.

## 2018-04-27 NOTE — ED Triage Notes (Signed)
Pt c/o pain to the right arch of foot since jumping in shallow water.

## 2018-09-19 ENCOUNTER — Emergency Department (HOSPITAL_COMMUNITY)
Admission: EM | Admit: 2018-09-19 | Discharge: 2018-09-19 | Disposition: A | Discharge status: Home / Self Care | Payer: Self-pay | Attending: Emergency Medicine | Admitting: Emergency Medicine

## 2018-09-19 ENCOUNTER — Encounter (HOSPITAL_COMMUNITY): Payer: Self-pay | Admitting: Emergency Medicine

## 2018-09-19 ENCOUNTER — Other Ambulatory Visit: Payer: Self-pay

## 2018-09-19 ENCOUNTER — Emergency Department (HOSPITAL_COMMUNITY): Payer: Self-pay

## 2018-09-19 DIAGNOSIS — Z87891 Personal history of nicotine dependence: Secondary | ICD-10-CM | POA: Insufficient documentation

## 2018-09-19 DIAGNOSIS — S20219A Contusion of unspecified front wall of thorax, initial encounter: Secondary | ICD-10-CM | POA: Insufficient documentation

## 2018-09-19 DIAGNOSIS — Y9302 Activity, running: Secondary | ICD-10-CM | POA: Insufficient documentation

## 2018-09-19 DIAGNOSIS — Z79899 Other long term (current) drug therapy: Secondary | ICD-10-CM | POA: Insufficient documentation

## 2018-09-19 DIAGNOSIS — Y929 Unspecified place or not applicable: Secondary | ICD-10-CM | POA: Insufficient documentation

## 2018-09-19 DIAGNOSIS — Y999 Unspecified external cause status: Secondary | ICD-10-CM | POA: Insufficient documentation

## 2018-09-19 DIAGNOSIS — W01198A Fall on same level from slipping, tripping and stumbling with subsequent striking against other object, initial encounter: Secondary | ICD-10-CM | POA: Insufficient documentation

## 2018-09-19 DIAGNOSIS — S29011A Strain of muscle and tendon of front wall of thorax, initial encounter: Secondary | ICD-10-CM | POA: Insufficient documentation

## 2018-09-19 MED ORDER — IBUPROFEN 600 MG PO TABS: 600.0000 mg | ORAL_TABLET | Freq: Four times a day (QID) | ORAL | 0 refills | Status: AC

## 2018-09-19 MED ORDER — CYCLOBENZAPRINE HCL 10 MG PO TABS
10.0000 mg | ORAL_TABLET | Freq: Once | ORAL | Status: AC
  Administered 2018-09-19: 10 mg via ORAL
  Filled 2018-09-19: qty 1

## 2018-09-19 MED ORDER — ONDANSETRON 4 MG PO TBDP
4.0000 mg | ORAL_TABLET | Freq: Once | ORAL | Status: AC
  Administered 2018-09-19: 4 mg via ORAL
  Filled 2018-09-19: qty 1

## 2018-09-19 MED ORDER — CYCLOBENZAPRINE HCL 10 MG PO TABS
10.0000 mg | ORAL_TABLET | Freq: Three times a day (TID) | ORAL | 0 refills | Status: AC
Start: 2018-09-19 — End: ?

## 2018-09-19 MED ORDER — TRAMADOL HCL 50 MG PO TABS
100.0000 mg | ORAL_TABLET | Freq: Once | ORAL | Status: AC
  Administered 2018-09-19: 100 mg via ORAL
  Filled 2018-09-19: qty 2

## 2018-09-19 NOTE — Discharge Instructions (Addendum)
Your vital signs are within normal limits.  Your oxygen level is 99% on room air.  Within normal limits by my interpretation.  A CT scan of your chest is negative for fracture of your ribs or sternum.  There is no evidence of bruising to your lungs.  There is a benign-appearing nodule versus scar in your lung.  This would not need any follow-up according to radiology.  Please use 600 mg of ibuprofen with breakfast, lunch, dinner, and at bedtime.  Use Flexeril for muscle spasm if needed. This medication may cause drowsiness. Please do not drink, drive, or participate in activity that requires concentration while taking this medication.

## 2018-09-19 NOTE — ED Triage Notes (Signed)
Pt c/o bilateral rib pain, LT side worse than RT since Friday. Pt states he fell into a hard plastic tub. States pain worsens with deep inspiration and movement.

## 2018-09-19 NOTE — ED Provider Notes (Signed)
[facility] EMERGENCY DEPARTMENT Provider Note   CSN: [id number] Arrival date & time: 09/19/18  0813     History   Chief Complaint Chief Complaint  Patient presents with  . Rib Injury    HPI [name] is a 27 y.o. male.  Patient is a 27 year old male who presents to the emergency department with a complaint of rib injury.  The patient states that 4 days ago he was running in the yard with his children, he fell and hit a hard plastic tub and injured the chest and rib area.  He says he has been using some over-the-counter medications, and also waiting to see if it would get better.  He says now he has pain when he takes a deep breath, when he coughs, and when he moves in certain directions.  He was advised by family members that he could have some broken ribs, and or lung injury and so he came to the emergency department to be evaluated.  No hemoptysis reported.  No high fever noted.  The patient denies being on any anticoagulation medications.  He has no history of bleeding disorders.  And there is been no operations or procedures involving the chest area.  The history is provided by the patient.    Past Medical History:  Diagnosis Date  . Bipolar 1 disorder (HCC)   . Depression   . Osteoarthritis     There are no active problems to display for this patient.   Past Surgical History:  Procedure Laterality Date  . TONSILLECTOMY    . VASECTOMY          Home Medications    Prior to Admission medications   Medication Sig Start Date End Date Taking? Authorizing Provider  cefaclor (CECLOR) 500 MG capsule Take 1 capsule (500 mg total) by mouth 2 (two) times daily. Patient not taking: Reported on 04/05/2017 03/05/16   Ivery Quale, PA-C  HYDROcodone-acetaminophen (NORCO/VICODIN) 5-325 MG tablet Take 1 tablet by mouth every 4 (four) hours as needed. Patient not taking: Reported on 04/05/2017 03/05/16   Ivery Quale, PA-C  LORazepam (ATIVAN) 1 MG tablet Take 1 tablet (1 mg  total) by mouth 3 (three) times daily as needed for anxiety. 04/05/17   Gerhard Munch, MD    Family History No family history on file.  Social History Social History   Tobacco Use  . Smoking status: Former Games developer  . Smokeless tobacco: Current User    Types: Snuff  Substance Use Topics  . Alcohol use: Yes    Comment: occassional  . Drug use: No     Allergies   Cashews [nuts]; Antihistamines, diphenhydramine-type; and Zoloft [sertraline hcl]   Review of Systems Review of Systems  Constitutional: Negative for activity change.       All ROS Neg except as noted in HPI  HENT: Negative for nosebleeds.   Eyes: Negative for photophobia and discharge.  Respiratory: Negative for cough, shortness of breath and wheezing.        Chest wall pain  Cardiovascular: Negative for chest pain and palpitations.  Gastrointestinal: Negative for abdominal pain and blood in stool.  Genitourinary: Negative for dysuria, frequency and hematuria.  Musculoskeletal: Negative for arthralgias, back pain and neck pain.  Skin: Negative.   Neurological: Negative for dizziness, seizures and speech difficulty.  Psychiatric/Behavioral: Negative for confusion and hallucinations.     Physical Exam Updated Vital Signs BP (!) 145/99 (BP Location: Right Arm)   Pulse 62   Temp 98.5 F (36.9  C) (Oral)   Resp 18   Ht 5\' 10"  (1.778 m)   Wt 77.1 kg   SpO2 99%   BMI 24.39 kg/m   Physical Exam  Constitutional: He is oriented to person, place, and time. He appears well-developed and well-nourished.  Non-toxic appearance.  HENT:  Head: Normocephalic.  Right Ear: Tympanic membrane and external ear normal.  Left Ear: Tympanic membrane and external ear normal.  Eyes: Pupils are equal, round, and reactive to light. EOM and lids are normal.  Neck: Normal range of motion. Neck supple. Carotid bruit is not present.  Cardiovascular: Normal rate, regular rhythm, normal heart sounds, intact distal pulses and normal  pulses.  Pulmonary/Chest: Breath sounds normal. No respiratory distress. He exhibits tenderness.  Patient has shallow respirations at rest.  He has pain with taking a deep breath.  There is symmetrical rise and fall of the chest.  The patient speaks in complete sentences without problem.  There is pain to palpation of multiple rib areas on the right and the left.  There is mild discomfort to the sternal area.  There is no noted bruising over the ribs on the right or the left.  No bruising on the sternal area.  No visible deformity.  Abdominal: Soft. Bowel sounds are normal. There is no tenderness. There is no guarding.  Musculoskeletal: Normal range of motion.  Lymphadenopathy:       Head (right side): No submandibular adenopathy present.       Head (left side): No submandibular adenopathy present.    He has no cervical adenopathy.  Neurological: He is alert and oriented to person, place, and time. He has normal strength. No cranial nerve deficit or sensory deficit.  Skin: Skin is warm and dry.  Psychiatric: He has a normal mood and affect. His speech is normal.  Nursing note and vitals reviewed.    ED Treatments / Results  Labs (all labs ordered are listed, but only abnormal results are displayed) Labs Reviewed - No data to display  EKG None  Radiology No results found.  Procedures Procedures (including critical care time)  Medications Ordered in ED Medications - No data to display   Initial Impression / Assessment and Plan / ED Course  I have reviewed the triage vital signs and the nursing notes.  Pertinent labs & imaging results that were available during my care of the patient were reviewed by me and considered in my medical decision making (see chart for details).       Final Clinical Impressions(s) / ED Diagnoses MDM  Pulse oximetry is 98% on room air.  Within normal limits by my interpretation.  Vital signs reviewed.  Patient speaks in complete sentences.  The  patient has shallow respirations, but there is symmetrical rise and fall of the chest.  CT scan of the chest is negative for rib fracture, or sternal fracture.  There is no evidence of injury to the lung.  There is a benign-appearing nodule in the lung, but no other problems reported.  I discussed these findings with the patient in terms of which he understands.  The patient is asked to use ibuprofen with breakfast, lunch, dinner, and at bedtime.  He is asked to use Flexeril 3 times daily, or every 6 hours for spasm pain.  Give the patient instructions that the Flexeril may cause drowsiness and to use it with caution.  Patient is in agreement with this plan.   Final diagnoses:  Contusion of chest wall, unspecified laterality,  initial encounter  Muscle strain of chest wall, initial encounter    ED Discharge Orders         Ordered    cyclobenzaprine (FLEXERIL) 10 MG tablet  3 times daily     09/19/18 1105    ibuprofen (ADVIL,MOTRIN) 600 MG tablet  4 times daily     09/19/18 1105           Ivery Quale, PA-C 09/19/18 1119    Pricilla Loveless, MD 09/19/18 1457

## 2024-11-28 ENCOUNTER — Ambulatory Visit (HOSPITAL_COMMUNITY)
Admission: RE | Admit: 2024-11-28 | Discharge: 2024-11-28 | Disposition: A | Discharge status: Home / Self Care | Source: Ambulatory Visit | Attending: Family Medicine | Admitting: Family Medicine

## 2024-11-28 ENCOUNTER — Other Ambulatory Visit (HOSPITAL_COMMUNITY): Payer: Self-pay | Admitting: Family Medicine

## 2024-11-28 DIAGNOSIS — M25539 Pain in unspecified wrist: Secondary | ICD-10-CM

## 2024-11-28 DIAGNOSIS — M25561 Pain in right knee: Secondary | ICD-10-CM
